# Patient Record
Sex: Male | Born: 1960 | Race: White | Hispanic: No | Marital: Married | Smoking: Current every day smoker
Health system: Southern US, Community
[De-identification: ages and names within clinical notes are randomized; demographics above are authoritative.]

## PROBLEM LIST (undated history)

## (undated) DIAGNOSIS — I340 Nonrheumatic mitral (valve) insufficiency: Secondary | ICD-10-CM

## (undated) DIAGNOSIS — I2510 Atherosclerotic heart disease of native coronary artery without angina pectoris: Secondary | ICD-10-CM

## (undated) DIAGNOSIS — I10 Essential (primary) hypertension: Secondary | ICD-10-CM

## (undated) DIAGNOSIS — E7800 Pure hypercholesterolemia, unspecified: Secondary | ICD-10-CM

## (undated) HISTORY — PX: CORONARY ANGIOPLASTY WITH STENT PLACEMENT: SHX49

## (undated) HISTORY — PX: CORONARY ANGIOPLASTY: SHX604

---

## 2004-10-20 ENCOUNTER — Ambulatory Visit: Payer: Self-pay

## 2004-11-06 ENCOUNTER — Ambulatory Visit: Payer: Self-pay

## 2005-03-14 ENCOUNTER — Inpatient Hospital Stay: Payer: Self-pay | Admitting: Unknown Physician Specialty

## 2006-08-15 ENCOUNTER — Emergency Department: Payer: Self-pay | Admitting: Emergency Medicine

## 2010-07-17 ENCOUNTER — Emergency Department: Payer: Self-pay | Admitting: Unknown Physician Specialty

## 2012-04-21 ENCOUNTER — Ambulatory Visit: Payer: Self-pay | Admitting: Gastroenterology

## 2013-06-20 ENCOUNTER — Ambulatory Visit: Payer: Self-pay | Admitting: Family Medicine

## 2013-12-09 ENCOUNTER — Ambulatory Visit: Payer: Self-pay | Admitting: Physician Assistant

## 2013-12-09 LAB — RAPID STREP-A WITH REFLX: MICRO TEXT REPORT: NEGATIVE

## 2013-12-09 LAB — RAPID INFLUENZA A&B ANTIGENS (ARMC ONLY)

## 2013-12-12 LAB — BETA STREP CULTURE(ARMC)

## 2015-09-06 ENCOUNTER — Other Ambulatory Visit: Payer: Self-pay

## 2015-09-06 ENCOUNTER — Ambulatory Visit
Admission: EM | Admit: 2015-09-06 | Discharge: 2015-09-06 | Disposition: A | Discharge status: Other Acute Inpatient Hospital | Payer: Medicare Other | Attending: Internal Medicine | Admitting: Internal Medicine

## 2015-09-06 ENCOUNTER — Encounter: Payer: Self-pay | Admitting: Emergency Medicine

## 2015-09-06 ENCOUNTER — Emergency Department: Payer: Medicare Other

## 2015-09-06 ENCOUNTER — Emergency Department
Admission: EM | Admit: 2015-09-06 | Discharge: 2015-09-06 | Disposition: A | Discharge status: Other Acute Inpatient Hospital | Payer: Medicare Other | Attending: Emergency Medicine | Admitting: Emergency Medicine

## 2015-09-06 DIAGNOSIS — I2510 Atherosclerotic heart disease of native coronary artery without angina pectoris: Secondary | ICD-10-CM | POA: Insufficient documentation

## 2015-09-06 DIAGNOSIS — Z9861 Coronary angioplasty status: Secondary | ICD-10-CM | POA: Diagnosis not present

## 2015-09-06 DIAGNOSIS — Z79899 Other long term (current) drug therapy: Secondary | ICD-10-CM | POA: Insufficient documentation

## 2015-09-06 DIAGNOSIS — I252 Old myocardial infarction: Secondary | ICD-10-CM | POA: Insufficient documentation

## 2015-09-06 DIAGNOSIS — Z7901 Long term (current) use of anticoagulants: Secondary | ICD-10-CM | POA: Diagnosis not present

## 2015-09-06 DIAGNOSIS — I249 Acute ischemic heart disease, unspecified: Secondary | ICD-10-CM | POA: Insufficient documentation

## 2015-09-06 DIAGNOSIS — R079 Chest pain, unspecified: Secondary | ICD-10-CM | POA: Diagnosis not present

## 2015-09-06 DIAGNOSIS — Z720 Tobacco use: Secondary | ICD-10-CM | POA: Diagnosis not present

## 2015-09-06 DIAGNOSIS — E7800 Pure hypercholesterolemia, unspecified: Secondary | ICD-10-CM | POA: Diagnosis not present

## 2015-09-06 DIAGNOSIS — I10 Essential (primary) hypertension: Secondary | ICD-10-CM | POA: Diagnosis not present

## 2015-09-06 DIAGNOSIS — R112 Nausea with vomiting, unspecified: Secondary | ICD-10-CM | POA: Diagnosis present

## 2015-09-06 DIAGNOSIS — Z7982 Long term (current) use of aspirin: Secondary | ICD-10-CM | POA: Diagnosis not present

## 2015-09-06 DIAGNOSIS — F17200 Nicotine dependence, unspecified, uncomplicated: Secondary | ICD-10-CM | POA: Diagnosis not present

## 2015-09-06 DIAGNOSIS — I959 Hypotension, unspecified: Secondary | ICD-10-CM | POA: Diagnosis present

## 2015-09-06 HISTORY — DX: Pure hypercholesterolemia, unspecified: E78.00

## 2015-09-06 HISTORY — DX: Essential (primary) hypertension: I10

## 2015-09-06 HISTORY — DX: Nonrheumatic mitral (valve) insufficiency: I34.0

## 2015-09-06 HISTORY — DX: Atherosclerotic heart disease of native coronary artery without angina pectoris: I25.10

## 2015-09-06 LAB — CBC
HEMATOCRIT: 40.5 % (ref 40.0–52.0)
HEMOGLOBIN: 13.8 g/dL (ref 13.0–18.0)
MCH: 31.3 pg (ref 26.0–34.0)
MCHC: 34.1 g/dL (ref 32.0–36.0)
MCV: 91.8 fL (ref 80.0–100.0)
Platelets: 389 10*3/uL (ref 150–440)
RBC: 4.41 MIL/uL (ref 4.40–5.90)
RDW: 13.5 % (ref 11.5–14.5)
WBC: 12.4 10*3/uL — AB (ref 3.8–10.6)

## 2015-09-06 LAB — PROTIME-INR
INR: 1.03
PROTHROMBIN TIME: 13.7 s (ref 11.4–15.0)

## 2015-09-06 LAB — BASIC METABOLIC PANEL
ANION GAP: 7 (ref 5–15)
BUN: 15 mg/dL (ref 6–20)
CHLORIDE: 104 mmol/L (ref 101–111)
CO2: 25 mmol/L (ref 22–32)
Calcium: 9.4 mg/dL (ref 8.9–10.3)
Creatinine, Ser: 1.42 mg/dL — ABNORMAL HIGH (ref 0.61–1.24)
GFR calc Af Amer: >60 mL/min (ref >60)
GFR, EST NON AFRICAN AMERICAN: 54 mL/min — AB (ref >60)
Glucose, Bld: 109 mg/dL — ABNORMAL HIGH (ref 65–99)
POTASSIUM: 3.5 mmol/L (ref 3.5–5.1)
SODIUM: 136 mmol/L (ref 135–145)

## 2015-09-06 LAB — TROPONIN I: Troponin I: <0.03 ng/mL (ref <0.031)

## 2015-09-06 MED ORDER — METOPROLOL TARTRATE 25 MG PO TABS
12.5000 mg | ORAL_TABLET | Freq: Once | ORAL | Status: AC
  Administered 2015-09-06: 12.5 mg via ORAL
  Filled 2015-09-06: qty 1

## 2015-09-06 MED ORDER — ASPIRIN 81 MG PO CHEW
324.0000 mg | CHEWABLE_TABLET | Freq: Once | ORAL | Status: AC
  Administered 2015-09-06: 324 mg via ORAL

## 2015-09-06 NOTE — ED Provider Notes (Signed)
CSN: [id number]     Arrival date & time 09/06/15  0845 History   First MD Initiated Contact with Patient 09/06/15 [phone]     Chief Complaint  Patient presents with  . Nausea  . Emesis  . Hypotension   HPI  Patient is a 54 year old gentleman who had a inferior wall coronary event, non-STEMI, treated at [facility] with stenting 2 per the patient, on 08/15/2015. He was very active working in the yard yesterday, mowing, then moving a lot of boxes. His companion reports that he was very winded. In the night, he was up vomiting, and had multiple episodes of diarrhea. His companion found blood on the wall this morning. Vital signs checked at home demonstrated new bradycardia, heart rate 50s. Patient continues with nausea, and intermittent chest pain. He is not dizzy. He was able to walk into the urgent care independently. No leg swelling. Patient was given 325 mg of aspirin by mouth.  Past Medical History  Diagnosis Date  . Coronary artery disease   . Hypertension   . High cholesterol    Past Surgical History  Procedure Laterality Date  . Coronary angioplasty     History reviewed. No pertinent family history. Social History  Substance Use Topics  . Smoking status: Current Every Day Smoker  . Smokeless tobacco: Never Used  . Alcohol Use: 7.2 - 10.8 oz/week    12-18 Cans of beer per week     Comment: 1-2 a month    Review of Systems  All other systems reviewed and are negative.   Allergies  Review of patient's allergies indicates no known allergies.  Home Medications   Prior to Admission medications   Medication Sig Start Date End Date Taking? Authorizing Provider  albuterol (PROVENTIL HFA;VENTOLIN HFA) 108 (90 BASE) MCG/ACT inhaler Inhale into the lungs every 6 (six) hours as needed for wheezing or shortness of breath.   Yes Historical Provider, MD  atorvastatin (LIPITOR) 20 MG tablet Take 20 mg by mouth daily.   Yes Historical Provider, MD  busPIRone (BUSPAR) 10 MG tablet Take 10  mg by mouth 3 (three) times daily.   Yes Historical Provider, MD  citalopram (CELEXA) 20 MG tablet Take 20 mg by mouth daily.   Yes Historical Provider, MD  dicyclomine (BENTYL) 10 MG capsule Take 10 mg by mouth 4 (four) times daily -  before meals and at bedtime.   Yes Historical Provider, MD  ibuprofen (ADVIL,MOTRIN) 200 MG tablet Take 200 mg by mouth every 6 (six) hours as needed.   Yes Historical Provider, MD  lisinopril (PRINIVIL,ZESTRIL) 10 MG tablet Take 10 mg by mouth daily.   Yes Historical Provider, MD  metoprolol (LOPRESSOR) 50 MG tablet Take 50 mg by mouth 2 (two) times daily.   Yes Historical Provider, MD  nitroGLYCERIN (NITROSTAT) 0.4 MG SL tablet Place 0.4 mg under the tongue every 5 (five) minutes as needed for chest pain.   Yes Historical Provider, MD  omeprazole (PRILOSEC) 20 MG capsule Take 20 mg by mouth daily.   Yes Historical Provider, MD  oxyCODONE (ROXICODONE) 15 MG immediate release tablet Take 15 mg by mouth every 4 (four) hours as needed for pain.   Yes Historical Provider, MD  QUEtiapine (SEROQUEL) 100 MG tablet Take 100 mg by mouth at bedtime.   Yes Historical Provider, MD  ticagrelor (BRILINTA) 90 MG TABS tablet Take by mouth 2 (two) times daily.   Yes Historical Provider, MD   Meds Ordered and Administered this Visit  Medications  aspirin chewable tablet 324 mg (324 mg Oral Given 09/06/15 0921)    BP 123/70 mmHg  Pulse 59  Resp 20  SpO2 100% Orthostatic VS for the past 24 hrs:  BP- Lying Pulse- Lying BP- Sitting Pulse- Sitting BP- Standing at 0 minutes Pulse- Standing at 0 minutes  09/06/15 0919 123/67 mmHg 60 123/71 mmHg 60 129/67 mmHg 60    Physical Exam  Constitutional: He is oriented to person, place, and time. No distress.  Alert, nicely groomed  HENT:  Head: Atraumatic.  Eyes:  Conjugate gaze, no eye redness/drainage  Neck: Neck supple.  Cardiovascular: Regular rhythm.   Heart rate 50s  Pulmonary/Chest: No respiratory distress. He has no  wheezes. He has no rales.  Lungs clear, symmetric breath sounds  Abdominal: Soft. He exhibits no distension. There is no rebound and no guarding.  Protuberant, mildly tender in the right lower quadrant to deep palpation  Musculoskeletal:  Trace distal lower extremity edema on the left, 1+ lower extremity edema on the right  Neurological: He is alert and oriented to person, place, and time.  Skin: Skin is warm and dry.  Pink. No cyanosis  Nursing note and vitals reviewed.   ED Course  Procedures (including critical care time)  I reviewed the patient's EKG, obtained at the urgent care on 09/06/2015: Sinus rhythm at a rate of 58 bpm, first-degree heart block with a PR interval of 244 ms, QRS duration 88 ms, QTC 416 ms. QRS axis -4. No acute ST/T-wave changes appreciated today. Q waves are demonstrated in the inferior leads.  I was not able to view the actual tracing of his previous EKG at [facility] on September 10; reported result of that tracing was demonstration of inferior wall changes.  MDM   1. Chest pain with high risk of acute coronary syndrome    Patient had coronary stenting less than 30 days ago, and is in the risk group for reocclusion; new exertional symptoms could be anginal/ischemic equivalent. Patient reports new, mild, bradycardia (heart rate 50s) Does not appear to be actively GI bleeding. Transfer by EMS to ED for evaluation for possible reocclusion/acute coronary syndrome.    Eustace Moore, MD 09/06/15 1024

## 2015-09-06 NOTE — ED Notes (Signed)
BP cuff malpositioned. Adjusted and BP repeated.

## 2015-09-06 NOTE — ED Notes (Addendum)
Pt reports chest pain for past 2-3 days for which he has taken nitro SL 2-3x/day with pain relief. No c/o chest pin at this time. Per pt's wife, he was very reluctant to come to hospital. Pt states he was awakened by nausea and vomited x1 with blood per wife, also c/o diarrhea x1. Pt taking plavix.

## 2015-09-06 NOTE — ED Notes (Signed)
Report called to [facility] transfer center. Ambulance to arrive around 2100.

## 2015-09-06 NOTE — ED Provider Notes (Addendum)
[facility] Emergency Department Provider Note  ____________________________________________   I have reviewed the triage vital signs and the nursing notes.   HISTORY  Chief Complaint Chest Pain and Hemoptysis    HPI Derek Solis is a 54 y.o. male with a history of GERD, diverticulosis, and an nSTEMI with 2 stents to the RCA at [facility] on the ninth of last month. Patient states that he woke up a little Vomited 1. It was noted there was blood in the vomit. He has not vomited since that time. He did have 3 loose bowel movements which were not bloody since that time. He is not having any abdominal pain or chest pain at this time. However, patient does state that he has had recurrent left-sided chest pain since he went home from his cardiac catheterization and especially over the last 3 days he has had some exertional chest discomfort with shortness of breath which is relieved by nitroglycerin. He has not talked was cardiologist about it. He has not had any chest pain while here. According to notes was given aspirin prior to arrival. He states he has been using his nitroglycerin glycerin several times a day over the last several days has not use to today  Past Medical History  Diagnosis Date  . Coronary artery disease   . Hypertension   . High cholesterol   . MI (mitral incompetence)     There are no active problems to display for this patient.   Past Surgical History  Procedure Laterality Date  . Coronary angioplasty    . Coronary angioplasty with stent placement      Current Outpatient Rx  Name  Route  Sig  Dispense  Refill  . albuterol (PROVENTIL HFA;VENTOLIN HFA) 108 (90 BASE) MCG/ACT inhaler   Inhalation   Inhale into the lungs every 6 (six) hours as needed for wheezing or shortness of breath.         Marland Kitchen atorvastatin (LIPITOR) 20 MG tablet   Oral   Take 20 mg by mouth daily.         . busPIRone (BUSPAR) 10 MG tablet   Oral   Take 10 mg by  mouth 3 (three) times daily.         . citalopram (CELEXA) 20 MG tablet   Oral   Take 20 mg by mouth daily.         Marland Kitchen dicyclomine (BENTYL) 10 MG capsule   Oral   Take 10 mg by mouth 4 (four) times daily -  before meals and at bedtime.         Marland Kitchen ibuprofen (ADVIL,MOTRIN) 200 MG tablet   Oral   Take 200 mg by mouth every 6 (six) hours as needed.         Marland Kitchen lisinopril (PRINIVIL,ZESTRIL) 10 MG tablet   Oral   Take 10 mg by mouth daily.         . metoprolol (LOPRESSOR) 50 MG tablet   Oral   Take 50 mg by mouth 2 (two) times daily.         . nitroGLYCERIN (NITROSTAT) 0.4 MG SL tablet   Sublingual   Place 0.4 mg under the tongue every 5 (five) minutes as needed for chest pain.         Marland Kitchen omeprazole (PRILOSEC) 20 MG capsule   Oral   Take 20 mg by mouth daily.         Marland Kitchen oxyCODONE (ROXICODONE) 15 MG immediate release tablet  Oral   Take 15 mg by mouth every 4 (four) hours as needed for pain.         Marland Kitchen QUEtiapine (SEROQUEL) 100 MG tablet   Oral   Take 100 mg by mouth at bedtime.         . ticagrelor (BRILINTA) 90 MG TABS tablet   Oral   Take by mouth 2 (two) times daily.           Allergies Review of patient's allergies indicates no known allergies.  History reviewed. No pertinent family history.  Social History Social History  Substance Use Topics  . Smoking status: Current Every Day Smoker -- 1.00 packs/day    Types: Cigarettes  . Smokeless tobacco: Never Used  . Alcohol Use: 7.2 - 10.8 oz/week    12-18 Cans of beer per week     Comment: 1-2 a month    Review of Systems Constitutional: No fever/chills Eyes: No visual changes. ENT: No sore throat. No stiff neck no neck pain Cardiovascular: chest pain. Respiratory: Denies shortness of breath. Gastrointestinal:   No melena Genitourinary: Negative for dysuria. Musculoskeletal: Negative lower extremity swelling Skin: Negative for rash. Neurological: Negative for headaches, focal weakness or  numbness. 10-point ROS otherwise negative.  ____________________________________________   PHYSICAL EXAM:  VITAL SIGNS: ED Triage Vitals  Enc Vitals Group     BP 09/06/15 1028 132/88 mmHg     Pulse Rate 09/06/15 1028 56     Resp 09/06/15 1028 20     Temp 09/06/15 1028 97.8 F (36.6 C)     Temp Source 09/06/15 1028 Oral     SpO2 09/06/15 1028 100 %     Weight 09/06/15 1028 185 lb (83.915 kg)     Height 09/06/15 1028  (1.803 m)     Head Cir --      Peak Flow --      Pain Score 09/06/15 1020 0     Pain Loc --      Pain Edu? --      Excl. in [place]? --     Constitutional: Alert and oriented. Well appearing and in no acute distress. Eyes: Conjunctivae are normal. PERRL. EOMI. Head: Atraumatic. Nose: No congestion/rhinnorhea. Mouth/Throat: Mucous membranes are moist.  Oropharynx non-erythematous. Neck: No stridor.   Nontender with no meningismus Cardiovascular: Normal rate, regular rhythm. Grossly normal heart sounds.  Good peripheral circulation. Respiratory: Normal respiratory effort.  No retractions. Lungs CTAB. Gastrointestinal: Soft and nontender. No distention. No guarding no rebound Back:  There is no focal tenderness or step off there is no midline tenderness there are no lesions noted. there is no CVA tenderness Rectal: Guaiac-negative stool Musculoskeletal: No lower extremity tenderness. No joint effusions, no DVT signs strong distal pulses no edema Neurologic:  Normal speech and language. No gross focal neurologic deficits are appreciated.  Skin:  Skin is warm, dry and intact. No rash noted. Psychiatric: Mood and affect are normal. Speech and behavior are normal.  ____________________________________________   LABS (all labs ordered are listed, but only abnormal results are displayed)  Labs Reviewed  BASIC METABOLIC PANEL - Abnormal; Notable for the following:    Glucose, Bld 109 (*)    Creatinine, Ser 1.42 (*)    GFR calc non Af Amer 54 (*)    All other  components within normal limits  CBC - Abnormal; Notable for the following:    WBC 12.4 (*)    All other components within normal limits  TROPONIN I  PROTIME-INR   ____________________________________________  EKG  Normal sinus rhythm rate 59 bpm, mild bradycardia noted, no acute ST elevation or depression, first degree AV block noted. ST flattening noted inferiorly. Flipped T waves in 3. ____________________________________________  RADIOLOGY  I have reviewed films ____________________________________________   PROCEDURES  Procedure(s) performed: None  Critical Care performed: None  ____________________________________________   INITIAL IMPRESSION / ASSESSMENT AND PLAN / ED COURSE  Pertinent labs & imaging results that were available during my care of the patient were reviewed by me and considered in my medical decision making (see chart for details).  Patient has had some GI upset with vomiting and loose stools today and no evidence of lower GI bleed and no persistent upper bleeding to suggest ongoing upper GI bleed. He is on Plavix however. Hemoglobin is reassuring vital signs are reassuring. More concerning to me, however, is the fact that the patient has been having ongoing pain in his chest which he describes as similar to his heart attack, he is not having the pain right now, I will discuss with cardiology at [facility] whom I have paged about what they would like to do about this. We'll monitor the patient closely here.. ____________________________________________   ----------------------------------------- 11:54 AM on 09/06/2015 -----------------------------------------  Dr. Liliana Cline excepts the patient for Dr. Mariane Baumgarten at [facility] cardiology, they have no additional requests and interventions at this time. We'll transfer back to [facility] at their request.  ----------------------------------------- 3:50 PM on 09/06/2015 -----------------------------------------  Patient  still with no chest pain, he would like to eat and drink while we await a bed at [facility]. Second troponin is negative. Still awaiting [facility] placement. I do feel patient should go given his increased anginal symptoms at home. He has had no chest pain here. Resting comfortably in the bed no vomiting or diarrhea abdominal and is benign signed out at the end of my shift  FINAL CLINICAL IMPRESSION(S) / ED DIAGNOSES  Final diagnoses:  None     Jeanmarie Plant, MD 09/06/15 1141  Jeanmarie Plant, MD 09/06/15 1142  Jeanmarie Plant, MD 09/06/15 1155  Jeanmarie Plant, MD 09/06/15 1550

## 2015-09-06 NOTE — ED Provider Notes (Addendum)
-----------------------------------------   5:21 PM on 09/06/2015 -----------------------------------------  Patient continues to await bed availability at [facility]. We will order the patient's normal nighttime medications. Patient is agreeable to continue waiting until bed becomes available.  Minna Antis, MD 09/06/15 1722   [facility] is just caused hitting a bed has become available. We will transfer as soon as possible.  Minna Antis, MD 09/06/15 1725

## 2015-09-06 NOTE — ED Notes (Addendum)
Patient with complaint of nausea, vomiting, low BP and not feeling well. Just had PCI 3 weeks ago for NSTEMI. Vomited last night and there was blood noted

## 2015-09-06 NOTE — ED Notes (Signed)
Report called to [facility] with updated report. Pt going to room 5711. Wife left prior to pt boarding ambulance. Report given to [facility] ambulance crew.

## 2015-09-06 NOTE — ED Notes (Signed)
Per EMS pt had an MI 3 weeks ago and 2 stents placed. Per wife pt started vomiting this morning at approx 1 am with blood in the vomit, wife states significant amount of bloody vomit noted around the toilet. Per wife and patient, he has been taking 2-3 nitro tabs a day since he was D/C with pain relief. Pt denies pain at this time, states intermittent chest pain.

## 2019-09-02 ENCOUNTER — Other Ambulatory Visit: Payer: Self-pay

## 2019-09-02 ENCOUNTER — Ambulatory Visit
Admission: EM | Admit: 2019-09-02 | Discharge: 2019-09-02 | Disposition: A | Discharge status: Home / Self Care | Payer: Medicare Other | Attending: Family Medicine | Admitting: Family Medicine

## 2019-09-02 ENCOUNTER — Encounter: Payer: Self-pay | Admitting: Emergency Medicine

## 2019-09-02 DIAGNOSIS — W540XXA Bitten by dog, initial encounter: Secondary | ICD-10-CM

## 2019-09-02 DIAGNOSIS — S0081XA Abrasion of other part of head, initial encounter: Secondary | ICD-10-CM | POA: Diagnosis not present

## 2019-09-02 DIAGNOSIS — S61509A Unspecified open wound of unspecified wrist, initial encounter: Secondary | ICD-10-CM

## 2019-09-02 NOTE — Discharge Instructions (Addendum)
Go directly to the emergency room as discussed.  °

## 2019-09-02 NOTE — ED Provider Notes (Signed)
[facility] ____________________________________________  Time seen: Approximately 1:44 PM  I have reviewed the triage vital signs and the nursing notes.   HISTORY  Chief Complaint Animal Bite   HPI Derek Solis is a 58 y.o. male presenting with wife at bedside for evaluation of multiple dog bites.  Just prior to arrival patient his wife had received a great Dane dog, and states as he went to grab the dog's collar the dog turned and bit him multiple times to head and both upper extremities.  Reports a lot of pain to his wrist.  Reports last tetanus immunization was 1 year ago.  Denies paresthesias, loss of consciousness or other injuries.  Reports to their knowledge the dog was up-to-date on immunization.  Wife did call animal control just prior to arrival.  No recent sickness.  Reports he is now only on aspirin and no other blood thinner.   Past Medical History:  Diagnosis Date  . Coronary artery disease   . High cholesterol   . Hypertension   . MI (mitral incompetence)     There are no active problems to display for this patient.   Past Surgical History:  Procedure Laterality Date  . CORONARY ANGIOPLASTY    . CORONARY ANGIOPLASTY WITH STENT PLACEMENT       No current facility-administered medications for this encounter.   Current Outpatient Medications:  .  albuterol (PROVENTIL HFA;VENTOLIN HFA) 108 (90 BASE) MCG/ACT inhaler, Inhale into the lungs every 6 (six) hours as needed for wheezing or shortness of breath., Disp: , Rfl:  .  atorvastatin (LIPITOR) 20 MG tablet, Take 20 mg by mouth daily., Disp: , Rfl:  .  busPIRone (BUSPAR) 10 MG tablet, Take 10 mg by mouth 3 (three) times daily., Disp: , Rfl:  .  citalopram (CELEXA) 20 MG tablet, Take 20 mg by mouth daily., Disp: , Rfl:  .  dicyclomine (BENTYL) 10 MG capsule, Take 10 mg by mouth 4 (four) times daily -  before meals and at bedtime., Disp: , Rfl:  .  ibuprofen (ADVIL,MOTRIN) 200 MG tablet, Take 200  mg by mouth every 6 (six) hours as needed., Disp: , Rfl:  .  lisinopril (PRINIVIL,ZESTRIL) 10 MG tablet, Take 10 mg by mouth daily., Disp: , Rfl:  .  metoprolol (LOPRESSOR) 50 MG tablet, Take 50 mg by mouth 2 (two) times daily., Disp: , Rfl:  .  nitroGLYCERIN (NITROSTAT) 0.4 MG SL tablet, Place 0.4 mg under the tongue every 5 (five) minutes as needed for chest pain., Disp: , Rfl:  .  omeprazole (PRILOSEC) 20 MG capsule, Take 20 mg by mouth daily., Disp: , Rfl:  .  oxyCODONE (ROXICODONE) 15 MG immediate release tablet, Take 15 mg by mouth every 4 (four) hours as needed for pain., Disp: , Rfl:  .  QUEtiapine (SEROQUEL) 100 MG tablet, Take 100 mg by mouth at bedtime., Disp: , Rfl:  .  ticagrelor (BRILINTA) 90 MG TABS tablet, Take by mouth 2 (two) times daily., Disp: , Rfl:   Allergies Patient has no known allergies.  No family history on file.  Social History Social History   Tobacco Use  . Smoking status: Current Every Day Smoker    Packs/day: 1.00    Types: Cigarettes  . Smokeless tobacco: Never Used  Substance Use Topics  . Alcohol use: Yes    Alcohol/week: 12.0 - 18.0 standard drinks    Types: 12 - 18 Cans of beer per week    Comment: 1-2 a month  .  Drug use: No    Review of Systems Constitutional: No fever ENT: No sore throat. Cardiovascular: Denies chest pain. Respiratory: Denies shortness of breath. Musculoskeletal: Negative for back pain. Skin: Positive lacerations and puncture wounds.  ____________________________________________   PHYSICAL EXAM:  VITAL SIGNS: ED Triage Vitals  Enc Vitals Group     BP 09/02/19 1316 130/79     Pulse Rate 09/02/19 1316 71     Resp 09/02/19 1316 18     Temp 09/02/19 1316 98.1 F (36.7 C)     Temp Source 09/02/19 1316 Oral     SpO2 09/02/19 1316 97 %     Weight --      Height --      Head Circumference --      Peak Flow --      Pain Score 09/02/19 1314 5     Pain Loc --      Pain Edu? --      Excl. in [place]? --      Constitutional: Alert and oriented. Well appearing and in no acute distress. Eyes: Conjunctivae are normal. ENT      Head: Normocephalic and atraumatic. Cardiovascular: Good peripheral circulation. Respiratory: Normal respiratory effort without tachypnea nor retractions. Neurologic:  Normal speech and language.  Skin:  Skin is warm, dry.  Except: Right forehead and beneath eye superficial abrasion noted, multiple bilateral upper extremity small puncture wounds, right lateral forearm approximate 3 cm linear laceration with mild active bleeding and approximately 3 cm laceration to volar aspect of distal wrist with gaping and active bleeding with pain. Psychiatric: Mood and affect are normal. Speech and behavior are normal. Patient exhibits appropriate insight and judgment   ___________________________________________   LABS (all labs ordered are listed, but only abnormal results are displayed)  Labs Reviewed - No data to display   PROCEDURES Procedures     INITIAL IMPRESSION / ASSESSMENT AND PLAN / ED COURSE  Pertinent labs & imaging results that were available during my care of the patient were reviewed by me and considered in my medical decision making (see chart for details).  Patient presenting with wife to urgent care with multiple puncture wounds and lacerations from large breed dog bite that occurred just prior to arrival.  Due to depth of laceration recommend further evaluation emergency room at this time.  Wife states she will take husband directly to [facility].  Patient wounds dressed by nursing staff and stable at discharge. ____________________________________________   FINAL CLINICAL IMPRESSION(S) / ED DIAGNOSES  Final diagnoses:  Dog bite, initial encounter     ED Discharge Orders    None       Note: This dictation was prepared with Dragon dictation along with smaller phrase technology. Any transcriptional errors that result from this process are  unintentional.         Marylene Land, NP 09/02/19 1449

## 2020-05-11 ENCOUNTER — Encounter: Payer: Self-pay | Admitting: Emergency Medicine

## 2020-05-11 ENCOUNTER — Ambulatory Visit
Admission: EM | Admit: 2020-05-11 | Discharge: 2020-05-11 | Disposition: A | Discharge status: Home / Self Care | Payer: Medicare Other | Attending: Family Medicine | Admitting: Family Medicine

## 2020-05-11 ENCOUNTER — Other Ambulatory Visit: Payer: Self-pay

## 2020-05-11 DIAGNOSIS — L02412 Cutaneous abscess of left axilla: Secondary | ICD-10-CM

## 2020-05-11 MED ORDER — DOXYCYCLINE HYCLATE 100 MG PO TABS: 100.0000 mg | ORAL_TABLET | Freq: Two times a day (BID) | ORAL | 0 refills | Status: DC

## 2020-05-11 NOTE — ED Provider Notes (Signed)
[facility]    CSN: [id number] Arrival date & time: 05/11/20  1245      History   Chief Complaint Chief Complaint  Patient presents with  . Abscess    left armpit    HPI Derek Solis is a 59 y.o. male.   59 yo male with a c/o left armpit redness and tenderness for the past 2 days. Denies any injuries, fevers, chills, drainage.    Abscess   Past Medical History:  Diagnosis Date  . Coronary artery disease   . High cholesterol   . Hypertension   . MI (mitral incompetence)     There are no problems to display for this patient.   Past Surgical History:  Procedure Laterality Date  . CORONARY ANGIOPLASTY    . CORONARY ANGIOPLASTY WITH STENT PLACEMENT         Home Medications    Prior to Admission medications   Medication Sig Start Date End Date Taking? Authorizing Provider  aspirin 81 MG EC tablet Take by mouth. 06/24/17  Yes [provider]  atorvastatin (LIPITOR) 20 MG tablet Take 20 mg by mouth daily.   Yes [provider]  busPIRone (BUSPAR) 10 MG tablet Take 10 mg by mouth 3 (three) times daily.   Yes [provider]  citalopram (CELEXA) 20 MG tablet Take 20 mg by mouth daily.   Yes [provider]  ferrous sulfate 325 (65 FE) MG tablet TAKE 1 TABLET(325 MG) BY MOUTH THREE TIMES DAILY WITH A MEAL 12/27/19  Yes [provider]  isosorbide mononitrate (IMDUR) 30 MG 24 hr tablet Take by mouth. 04/09/20  Yes [provider]  lisinopril (PRINIVIL,ZESTRIL) 10 MG tablet Take 10 mg by mouth daily.   Yes [provider]  metoprolol (LOPRESSOR) 50 MG tablet Take 50 mg by mouth 2 (two) times daily.   Yes [provider]  omeprazole (PRILOSEC) 20 MG capsule Take 20 mg by mouth daily.   Yes [provider]  QUEtiapine (SEROQUEL) 100 MG tablet Take 100 mg by mouth at bedtime.   Yes [provider]  traZODone (DESYREL) 100 MG tablet Take 100 mg by mouth at bedtime as needed.  12/27/19  Yes [provider]  albuterol (PROVENTIL HFA;VENTOLIN HFA) 108 (90 BASE) MCG/ACT inhaler Inhale into the lungs every 6 (six) hours as needed for wheezing or shortness of breath.    [provider]  dicyclomine (BENTYL) 10 MG capsule Take 10 mg by mouth 4 (four) times daily -  before meals and at bedtime.    [provider]  doxycycline (VIBRA-TABS) 100 MG tablet Take 1 tablet (100 mg total) by mouth 2 (two) times daily. 05/11/20   Derek Mccallum, MD  ibuprofen (ADVIL,MOTRIN) 200 MG tablet Take 200 mg by mouth every 6 (six) hours as needed.    [provider]  nitroGLYCERIN (NITROSTAT) 0.4 MG SL tablet Place 0.4 mg under the tongue every 5 (five) minutes as needed for chest pain.    [provider]  oxyCODONE (ROXICODONE) 15 MG immediate release tablet Take 15 mg by mouth every 4 (four) hours as needed for pain.    [provider]  ticagrelor (BRILINTA) 90 MG TABS tablet Take by mouth 2 (two) times daily.    [provider]    Family History History reviewed. No pertinent family history.  Social History Social History   Tobacco Use  . Smoking status: Current Every Day Smoker    Packs/day: 1.00  Types: Cigarettes  . Smokeless tobacco: Never Used  Substance Use Topics  . Alcohol use: Yes    Alcohol/week: 12.0 - 18.0 standard drinks    Types: 12 - 18 Cans of beer per week    Comment: 1-2 a month  . Drug use: No     Allergies   Patient has no known allergies.   Review of Systems Review of Systems   Physical Exam Triage Vital Signs ED Triage Vitals  Enc Vitals Group     BP 05/11/20 1334 108/66     Pulse Rate 05/11/20 1334 64     Resp 05/11/20 1334 16     Temp 05/11/20 1334 98.5 F (36.9 C)     Temp Source 05/11/20 1334 Oral     SpO2 05/11/20 1334 97 %     Weight 05/11/20 1331 195 lb (88.5 kg)     Height 05/11/20 1331 5\' 11"  (1.803 m)     Head Circumference --      Peak Flow --      Pain Score  05/11/20 1331 7     Pain Loc --      Pain Edu? --      Excl. in [place]? --    No data found.  Updated Vital Signs BP 108/66 (BP Location: Right Arm)   Pulse 64   Temp 98.5 F (36.9 C) (Oral)   Resp 16   Ht 5\' 11"  (1.803 m)   Wt 88.5 kg   SpO2 97%   BMI 27.20 kg/m   Visual Acuity Right Eye Distance:   Left Eye Distance:   Bilateral Distance:    Right Eye Near:   Left Eye Near:    Bilateral Near:     Physical Exam Vitals and nursing note reviewed.  Constitutional:      General: He is not in acute distress.    Appearance: He is not toxic-appearing or diaphoretic.  Skin:    Comments: Left axilla skin with 1.5cm tender, subcutaneous induration with surrounding area 3x4cm of blanchable erythema, warmth and tenderness to palpation; no drainage; no fluctuance  Neurological:     Mental Status: He is alert.      UC Treatments / Results  Labs (all labs ordered are listed, but only abnormal results are displayed) Labs Reviewed - No data to display  EKG   Radiology No results found.  Procedures Procedures (including critical care time)  Medications Ordered in UC Medications - No data to display  Initial Impression / Assessment and Plan / UC Course  I have reviewed the triage vital signs and the nursing notes.  Pertinent labs & imaging results that were available during my care of the patient were reviewed by me and considered in my medical decision making (see chart for details).      Final Clinical Impressions(s) / UC Diagnoses   Final diagnoses:  Abscess of left axilla     Discharge Instructions     Warm compresses to area    ED Prescriptions    Medication Sig Dispense Auth. Provider   doxycycline (VIBRA-TABS) 100 MG tablet Take 1 tablet (100 mg total) by mouth 2 (two) times daily. 20 tablet Norval Gable, MD      1. diagnosis reviewed with patient 2. rx as per orders above; reviewed possible side effects, interactions, risks and benefits  3.  Recommend supportive treatment as above 4. Follow-up prn if symptoms worsen or don't improve   PDMP not reviewed this encounter.   Derek Solis,  Pamala Hurry, MD 05/11/20 1426

## 2020-05-11 NOTE — ED Triage Notes (Signed)
Patient c/o abscess in his left armpit for the past 2 days.  Patient denies fevers.

## 2020-05-11 NOTE — Discharge Instructions (Signed)
Warm compresses to area °

## 2020-08-18 ENCOUNTER — Other Ambulatory Visit: Payer: Self-pay

## 2020-08-18 ENCOUNTER — Ambulatory Visit
Admission: EM | Admit: 2020-08-18 | Discharge: 2020-08-18 | Disposition: A | Discharge status: Home / Self Care | Payer: Medicare Other | Attending: Internal Medicine | Admitting: Internal Medicine

## 2020-08-18 ENCOUNTER — Encounter: Payer: Self-pay | Admitting: Emergency Medicine

## 2020-08-18 DIAGNOSIS — L02411 Cutaneous abscess of right axilla: Secondary | ICD-10-CM

## 2020-08-18 DIAGNOSIS — L02412 Cutaneous abscess of left axilla: Secondary | ICD-10-CM

## 2020-08-18 MED ORDER — DOXYCYCLINE HYCLATE 100 MG PO CAPS: 100.0000 mg | ORAL_CAPSULE | Freq: Two times a day (BID) | ORAL | 0 refills | Status: DC

## 2020-08-18 NOTE — Discharge Instructions (Signed)
Use heat pad on abscess for 15 minutes 2-3 times a day 5 days to help it get better.

## 2020-08-18 NOTE — ED Provider Notes (Signed)
[facility]    CSN: [id number] Arrival date & time: 08/18/20  1141      History   Chief Complaint Chief Complaint  Patient presents with  . Abscess    HPI Derek Solis is a 59 y.o. male who presents with bilateral axilla abscess. The onset of R axilla abscess 3 weeks ago and has moved to the left in the past 1 week. Has had this a few months ago and was placed on antibiotics. Denies a fever.    Past Medical History:  Diagnosis Date  . Coronary artery disease   . High cholesterol   . Hypertension   . MI (mitral incompetence)     There are no problems to display for this patient.   Past Surgical History:  Procedure Laterality Date  . CORONARY ANGIOPLASTY    . CORONARY ANGIOPLASTY WITH STENT PLACEMENT         Home Medications    Prior to Admission medications   Medication Sig Start Date End Date Taking? Authorizing Provider  albuterol (PROVENTIL HFA;VENTOLIN HFA) 108 (90 BASE) MCG/ACT inhaler Inhale into the lungs every 6 (six) hours as needed for wheezing or shortness of breath.   Yes [provider]  aspirin 81 MG EC tablet Take by mouth. 06/24/17  Yes [provider]  atorvastatin (LIPITOR) 20 MG tablet Take 20 mg by mouth daily.   Yes [provider]  busPIRone (BUSPAR) 10 MG tablet Take 10 mg by mouth 3 (three) times daily.   Yes [provider]  citalopram (CELEXA) 20 MG tablet Take 20 mg by mouth daily.   Yes [provider]  ferrous sulfate 325 (65 FE) MG tablet TAKE 1 TABLET(325 MG) BY MOUTH THREE TIMES DAILY WITH A MEAL 12/27/19  Yes [provider]  ibuprofen (ADVIL,MOTRIN) 200 MG tablet Take 200 mg by mouth every 6 (six) hours as needed.   Yes [provider]  isosorbide mononitrate (IMDUR) 30 MG 24 hr tablet Take by mouth. 04/09/20  Yes [provider]  lisinopril (PRINIVIL,ZESTRIL) 10 MG tablet Take 10 mg by mouth daily.   Yes [provider]  metoprolol  (LOPRESSOR) 50 MG tablet Take 50 mg by mouth 2 (two) times daily.   Yes [provider]  nitroGLYCERIN (NITROSTAT) 0.4 MG SL tablet Place 0.4 mg under the tongue every 5 (five) minutes as needed for chest pain.   Yes [provider]  omeprazole (PRILOSEC) 20 MG capsule Take 20 mg by mouth daily.   Yes [provider]  QUEtiapine (SEROQUEL) 100 MG tablet Take 100 mg by mouth at bedtime.   Yes [provider]  traZODone (DESYREL) 100 MG tablet Take 100 mg by mouth at bedtime as needed. 12/27/19  Yes [provider]  dicyclomine (BENTYL) 10 MG capsule Take 10 mg by mouth 4 (four) times daily -  before meals and at bedtime.    [provider]  doxycycline (VIBRA-TABS) 100 MG tablet Take 1 tablet (100 mg total) by mouth 2 (two) times daily. 05/11/20   [name], MD  oxyCODONE (ROXICODONE) 15 MG immediate release tablet Take 15 mg by mouth every 4 (four) hours as needed for pain.    [provider]  ticagrelor (BRILINTA) 90 MG TABS tablet Take by mouth 2 (two) times daily.    [provider]    Family History History reviewed. No pertinent family history.  Social History Social History   Tobacco Use  . Smoking status: Current  Every Day Smoker    Packs/day: 1.00    Types: Cigarettes  . Smokeless tobacco: Never Used  Vaping Use  . Vaping Use: Never used  Substance Use Topics  . Alcohol use: Yes    Alcohol/week: 12.0 - 18.0 standard drinks    Types: 12 - 18 Cans of beer per week    Comment: 1-2 a month  . Drug use: No     Allergies   Patient has no known allergies.   Review of Systems Review of Systems  Constitutional: Negative for fever.  HENT: Negative for congestion.   Respiratory: Negative for cough.   Musculoskeletal: Negative for gait problem and myalgias.  Skin: Negative for rash.       Has lumps and redness on axilla  Hematological: Negative for adenopathy.   Physical Exam Triage Vital Signs ED  Triage Vitals  Enc Vitals Group     BP 08/18/20 1255 (!) 149/76     Pulse Rate 08/18/20 1255 64     Resp 08/18/20 1255 18     Temp 08/18/20 1255 98.2 F (36.8 C)     Temp Source 08/18/20 1255 Oral     SpO2 08/18/20 1255 99 %     Weight 08/18/20 1251 195 lb 1.7 oz (88.5 kg)     Height 08/18/20 1251 5\' 11"  (1.803 m)     Head Circumference --      Peak Flow --      Pain Score 08/18/20 1251 7     Pain Loc --      Pain Edu? --      Excl. in [place]? --    No data found.  Updated Vital Signs BP (!) 149/76 (BP Location: Left Arm)   Pulse 64   Temp 98.2 F (36.8 C) (Oral)   Resp 18   Ht 5\' 11"  (1.803 m)   Wt 195 lb 1.7 oz (88.5 kg)   SpO2 99%   BMI 27.21 kg/m   Visual Acuity Right Eye Distance:   Left Eye Distance:   Bilateral Distance:    Right Eye Near:   Left Eye Near:    Bilateral Near:     Physical Exam Constitutional:      General: He is not in acute distress.    Appearance: He is not toxic-appearing.  HENT:     Head: Normocephalic.     Right Ear: Tympanic membrane and external ear normal.     Left Ear: Tympanic membrane, ear canal and external ear normal.  Eyes:     General: No scleral icterus.    Conjunctiva/sclera: Conjunctivae normal.  Pulmonary:     Effort: Pulmonary effort is normal.  Musculoskeletal:        General: Normal range of motion.     Cervical back: Neck supple.     Comments: R AXILLA- with 4 superficial lumps 1-1.5 cm in size and only one is red and tender.  L AXILLA- 2x1 cm oval superficial abscess.   Skin:    General: Skin is warm and dry.  Neurological:     Mental Status: He is alert and oriented to person, place, and time.     Gait: Gait normal.  Psychiatric:        Mood and Affect: Mood normal.        Behavior: Behavior normal.        Thought Content: Thought content normal.        Judgment: Judgment normal.    UC Treatments / Results  Labs (all labs ordered are listed, but only abnormal results are displayed) Labs Reviewed - No  data to display  EKG   Radiology No results found.  Procedures Procedures (including critical care time)  Medications Ordered in UC Medications - No data to display  Initial Impression / Assessment and Plan / UC Course  I have reviewed the triage vital signs and the nursing notes. Has bilateral axilla abscesses. I placed him on Doxycycline as noted. Advised to apply heat to areas. See instructions.  Final Clinical Impressions(s) / UC Diagnoses   Final diagnoses:  None   Discharge Instructions   None    ED Prescriptions    None     PDMP not reviewed this encounter.   Garey Ham, [place] 08/18/20 2054

## 2020-08-18 NOTE — ED Triage Notes (Signed)
Pt has abscess in bilateral axilla. Started about 3 weeks ago. Started out on in right axilla and moved to left.

## 2021-08-14 ENCOUNTER — Ambulatory Visit
Admission: EM | Admit: 2021-08-14 | Discharge: 2021-08-14 | Disposition: A | Discharge status: Home / Self Care | Payer: Medicare Other | Attending: Emergency Medicine | Admitting: Emergency Medicine

## 2021-08-14 ENCOUNTER — Encounter: Payer: Self-pay | Admitting: Emergency Medicine

## 2021-08-14 ENCOUNTER — Other Ambulatory Visit: Payer: Self-pay

## 2021-08-14 DIAGNOSIS — H00025 Hordeolum internum left lower eyelid: Secondary | ICD-10-CM | POA: Diagnosis not present

## 2021-08-14 DIAGNOSIS — S0502XA Injury of conjunctiva and corneal abrasion without foreign body, left eye, initial encounter: Secondary | ICD-10-CM | POA: Diagnosis not present

## 2021-08-14 MED ORDER — TOBRAMYCIN-DEXAMETHASONE 0.3-0.1 % OP SUSP: 2.0000 [drp] | Freq: Four times a day (QID) | OPHTHALMIC | 0 refills | Status: DC

## 2021-08-14 NOTE — Discharge Instructions (Addendum)
Instill 2 drops of Tobradex in your left eye 4 times a day for 7 days.  If you develop worsening pain in your left eye, or changes to your vision go to the ER for evaluation.

## 2021-08-14 NOTE — ED Provider Notes (Signed)
[facility]    CSN: [id number] Arrival date & time: 08/14/21  1617      History   Chief Complaint Chief Complaint  Patient presents with   Eye Problem    HPI Derek Solis is a 60 y.o. male.   HPI  60 year old male here for evaluation of eye complaints.  Patient reports that he developed a foreign body sensation in his left eye yesterday while walking to the parking deck at [facility].  He states that the pain is continued today and he feels like there is something in his eye.  His wife is flush his eye with eyewash several times without relief of symptoms.  He states that his vision is a little blurry in the left eye.  He reports that he was mowing the grass today but did not do anything yesterday that might of caused a foreign body to go into his eye.  Patient denies drainage.  Past Medical History:  Diagnosis Date   Coronary artery disease    High cholesterol    Hypertension    MI (mitral incompetence)     There are no problems to display for this patient.   Past Surgical History:  Procedure Laterality Date   CORONARY ANGIOPLASTY     CORONARY ANGIOPLASTY WITH STENT PLACEMENT         Home Medications    Prior to Admission medications   Medication Sig Start Date End Date Taking? Authorizing Provider  tobramycin-dexamethasone [facility]) ophthalmic solution Place 2 drops into the left eye every 6 (six) hours. 08/14/21  Yes Becky Augusta, NP  albuterol (PROVENTIL HFA;VENTOLIN HFA) 108 (90 BASE) MCG/ACT inhaler Inhale into the lungs every 6 (six) hours as needed for wheezing or shortness of breath.    [provider]  aspirin 81 MG EC tablet Take by mouth. 06/24/17   [provider]  atorvastatin (LIPITOR) 20 MG tablet Take 20 mg by mouth daily.    [provider]  busPIRone (BUSPAR) 10 MG tablet Take 10 mg by mouth 3 (three) times daily.    [provider]  citalopram (CELEXA) 20 MG tablet Take 20 mg by mouth  daily.    [provider]  dicyclomine (BENTYL) 10 MG capsule Take 10 mg by mouth 4 (four) times daily -  before meals and at bedtime.    [provider]  ferrous sulfate 325 (65 FE) MG tablet TAKE 1 TABLET(325 MG) BY MOUTH THREE TIMES DAILY WITH A MEAL 12/27/19   [provider]  ibuprofen (ADVIL,MOTRIN) 200 MG tablet Take 200 mg by mouth every 6 (six) hours as needed.    [provider]  isosorbide mononitrate (IMDUR) 30 MG 24 hr tablet Take by mouth. 04/09/20   [provider]  lisinopril (PRINIVIL,ZESTRIL) 10 MG tablet Take 10 mg by mouth daily.    [provider]  metoprolol (LOPRESSOR) 50 MG tablet Take 50 mg by mouth 2 (two) times daily.    [provider]  nitroGLYCERIN (NITROSTAT) 0.4 MG SL tablet Place 0.4 mg under the tongue every 5 (five) minutes as needed for chest pain.    [provider]  omeprazole (PRILOSEC) 20 MG capsule Take 20 mg by mouth daily.    [provider]  oxyCODONE (ROXICODONE) 15 MG immediate release tablet Take 15 mg by mouth every 4 (four) hours as needed for pain.    [provider]  QUEtiapine (SEROQUEL) 100 MG tablet Take 100 mg by mouth at bedtime.  [provider]  traZODone (DESYREL) 100 MG tablet Take 100 mg by mouth at bedtime as needed. 12/27/19   [provider]    Family History History reviewed. No pertinent family history.  Social History Social History   Tobacco Use   Smoking status: Every Day    Packs/day: 1.00    Types: Cigarettes   Smokeless tobacco: Never  Vaping Use   Vaping Use: Never used  Substance Use Topics   Alcohol use: Yes    Alcohol/week: 12.0 - 18.0 standard drinks    Types: 12 - 18 Cans of beer per week    Comment: 1-2 a month   Drug use: No     Allergies   Patient has no known allergies.   Review of Systems Review of Systems  Constitutional:  Negative for activity change, appetite change and fever.  Eyes:   Positive for pain and visual disturbance. Negative for photophobia, discharge, redness and itching.    Physical Exam Triage Vital Signs ED Triage Vitals  Enc Vitals Group     BP      Pulse      Resp      Temp      Temp src      SpO2      Weight      Height      Head Circumference      Peak Flow      Pain Score      Pain Loc      Pain Edu?      Excl. in [place]?    No data found.  Updated Vital Signs BP 129/78   Pulse 71   Temp 98.2 F (36.8 C) (Oral)   Resp 17   SpO2 96%   Visual Acuity Right Eye Distance:   Left Eye Distance:   Bilateral Distance:    Right Eye Near:   Left Eye Near:    Bilateral Near:     Physical Exam Vitals and nursing note reviewed.  Constitutional:      General: He is not in acute distress.    Appearance: Normal appearance. He is normal weight. He is not ill-appearing.  HENT:     Head: Normocephalic and atraumatic.  Eyes:     General: No scleral icterus.       Right eye: No discharge.        Left eye: No discharge.     Extraocular Movements: Extraocular movements intact.     Conjunctiva/sclera: Conjunctivae normal.     Pupils: Pupils are equal, round, and reactive to light.  Skin:    General: Skin is warm and dry.     Capillary Refill: Capillary refill takes less than 2 seconds.     Findings: No erythema or rash.  Neurological:     General: No focal deficit present.     Mental Status: He is alert and oriented to person, place, and time.  Psychiatric:        Mood and Affect: Mood normal.        Behavior: Behavior normal.        Thought Content: Thought content normal.        Judgment: Judgment normal.     UC Treatments / Results  Labs (all labs ordered are listed, but only abnormal results are displayed) Labs Reviewed - No data to display  EKG   Radiology No results found.  Procedures Procedures (including critical care time)  Medications Ordered in UC Medications - No  data to display  Initial Impression /  Assessment and Plan / UC Course  I have reviewed the triage vital signs and the nursing notes.  Pertinent labs & imaging results that were available during my care of the patient were reviewed by me and considered in my medical decision making (see chart for details).  Patient is a nontoxic-appearing 60 year old male here for evaluation of foreign body sensation in his left eye that started last evening.  Patient does not remember getting any thing in his eye but he states that he was walking out to the parking deck at [facility] and he felt what he thought was something going to his eye.  He told his wife this evening that he still has a foreign body sensation in his eye and he is having a little blurry vision in his left eye so they brought him in for evaluation.  Physical exam reveals no foreign body upon eversion of the upper and lower eyelids.  There is a stye in the central aspect of the lower eyelid.  The eye was stained with fluorescein dye after being anesthetized with 2 drops of tetracaine.  The eye was then examined with a Woods lamp which revealed a corneal abrasion in the inferior medial aspect of the iris.  There is no appreciable foreign body.  The stye in the central lower eyelid was easily ruptured with a cotton-tipped applicator.  Will discharge patient home on TobraDex to treat the corneal abrasion and help with inflammation.   Final Clinical Impressions(s) / UC Diagnoses   Final diagnoses:  Abrasion of left cornea, initial encounter  Hordeolum internum of left lower eyelid     Discharge Instructions      Instill 2 drops of Tobradex in your left eye 4 times a day for 7 days.  If you develop worsening pain in your left eye, or changes to your vision go to the ER for evaluation.     ED Prescriptions     Medication Sig Dispense Auth. Provider   tobramycin-dexamethasone [facility]) ophthalmic solution Place 2 drops into the left eye every 6 (six) hours. 5 mL Becky Augusta, NP      PDMP not reviewed this encounter.   Becky Augusta, NP 08/14/21 [phone]

## 2021-08-14 NOTE — ED Triage Notes (Signed)
Pt is present today with left eye problem. Pt states that it feels like something is in his eye that he noticed yesterday

## 2022-04-12 ENCOUNTER — Other Ambulatory Visit: Payer: Self-pay

## 2022-04-12 ENCOUNTER — Ambulatory Visit (INDEPENDENT_AMBULATORY_CARE_PROVIDER_SITE_OTHER): Payer: Medicare Other

## 2022-04-12 ENCOUNTER — Ambulatory Visit
Admission: EM | Admit: 2022-04-12 | Discharge: 2022-04-12 | Disposition: A | Discharge status: Home / Self Care | Payer: Medicare Other | Attending: Internal Medicine | Admitting: Internal Medicine

## 2022-04-12 DIAGNOSIS — S39012A Strain of muscle, fascia and tendon of lower back, initial encounter: Secondary | ICD-10-CM

## 2022-04-12 DIAGNOSIS — M545 Low back pain, unspecified: Secondary | ICD-10-CM | POA: Diagnosis not present

## 2022-04-12 MED ORDER — KETOROLAC TROMETHAMINE 60 MG/2ML IM SOLN
60.0000 mg | Freq: Once | INTRAMUSCULAR | Status: AC
Start: 2022-04-12 — End: 2022-04-12
  Administered 2022-04-12: 60 mg via INTRAMUSCULAR

## 2022-04-12 MED ORDER — BACLOFEN 10 MG PO TABS: 10.0000 mg | ORAL_TABLET | Freq: Three times a day (TID) | ORAL | 0 refills | Status: AC

## 2022-04-12 NOTE — ED Provider Notes (Signed)
?[facility] ? ? ? ?CSN: [id number] ?Arrival date & time: 04/12/22  0856 ? ? ?  ? ?History   ?Chief Complaint ?Chief Complaint  ?Patient presents with  ? Back Pain  ? ? ?HPI ?Derek Solis is a 61 y.o. male who presents with L lower back pain x 1 week. He had been spraying for weeks. Has tried heat which seemed to help. He first injured it 4 weeks ago when he stepped into a hole, but got better after one week. He has been working on building a fence and was spraying the gound the past few weeks. Denies hx of back pain or this type pain before. Pain is provoked trying to straighten his back, bending over or coughing. The pain does not radiate. Denies urinary symptoms or paresthesia.  ? ? ? ?Past Medical History:  ?Diagnosis Date  ? Coronary artery disease   ? High cholesterol   ? Hypertension   ? MI (mitral incompetence)   ? ? ?There are no problems to display for this patient. ? ? ?Past Surgical History:  ?Procedure Laterality Date  ? CORONARY ANGIOPLASTY    ? CORONARY ANGIOPLASTY WITH STENT PLACEMENT    ? ? ? ? ? ?Home Medications   ? ?Prior to Admission medications   ?Medication Sig Start Date End Date Taking? Authorizing Provider  ?baclofen (LIORESAL) 10 MG tablet Take 1 tablet (10 mg total) by mouth 3 (three) times daily. 04/12/22  Yes Rodriguez-Southworth, Nettie ElmSylvia, PA-C  ?albuterol (PROVENTIL HFA;VENTOLIN HFA) 108 (90 BASE) MCG/ACT inhaler Inhale into the lungs every 6 (six) hours as needed for wheezing or shortness of breath.    [provider]  ?aspirin 81 MG EC tablet Take by mouth. 06/24/17   [provider]  ?atorvastatin (LIPITOR) 20 MG tablet Take 20 mg by mouth daily.    [provider]  ?busPIRone (BUSPAR) 10 MG tablet Take 10 mg by mouth 3 (three) times daily.    [provider]  ?citalopram (CELEXA) 20 MG tablet Take 20 mg by mouth daily.    [provider]  ?ferrous sulfate 325 (65 FE) MG tablet TAKE 1 TABLET(325 MG) BY MOUTH THREE TIMES DAILY  WITH A MEAL 12/27/19   [provider]  ?ibuprofen (ADVIL,MOTRIN) 200 MG tablet Take 200 mg by mouth every 6 (six) hours as needed.    [provider]  ?isosorbide mononitrate (IMDUR) 30 MG 24 hr tablet Take by mouth. 04/09/20   [provider]  ?lisinopril (PRINIVIL,ZESTRIL) 10 MG tablet Take 10 mg by mouth daily.    [provider]  ?metoprolol (LOPRESSOR) 50 MG tablet Take 50 mg by mouth 2 (two) times daily.    [provider]  ?nitroGLYCERIN (NITROSTAT) 0.4 MG SL tablet Place 0.4 mg under the tongue every 5 (five) minutes as needed for chest pain.    [provider]  ?omeprazole (PRILOSEC) 20 MG capsule Take 20 mg by mouth daily.    [provider]  ?QUEtiapine (SEROQUEL) 100 MG tablet Take 100 mg by mouth at bedtime.    [provider]  ?traZODone (DESYREL) 100 MG tablet Take 100 mg by mouth at bedtime as needed. 12/27/19   [provider]  ? ? ?Family History ?History reviewed. No pertinent family history. ? ?Social History ?Social History  ? ?Tobacco Use  ? Smoking status: Every Day  ?  Packs/day: 1.00  ?  Types: Cigarettes  ? Smokeless tobacco: Never  ?Vaping Use  ? Vaping Use:  Never used  ?Substance Use Topics  ? Alcohol use: Not Currently  ?  Alcohol/week: 12.0 - 18.0 standard drinks  ?  Types: 12 - 18 Cans of beer per week  ?  Comment: 1-2 a month  ? Drug use: No  ? ? ? ?Allergies   ?Alpha-gal ? ? ?Review of Systems ?Review of Systems  ?Constitutional:  Negative for chills, diaphoresis and fever.  ?Gastrointestinal:  Negative for abdominal pain.  ?Genitourinary:  Negative for difficulty urinating, dysuria and frequency.  ?     Denies hesitation  ?Musculoskeletal:  Positive for back pain.  ?Skin:  Negative for rash.  ?Neurological:  Negative for weakness and numbness.  ? ? ?Physical Exam ?Triage Vital Signs ?ED Triage Vitals  ?Enc Vitals Group  ?   BP 04/12/22 0920 135/71  ?   Pulse Rate 04/12/22 0920 67  ?   Resp 04/12/22 0920 16   ?   Temp 04/12/22 0920 97.8 ?F (36.6 ?C)  ?   Temp Source 04/12/22 0920 Oral  ?   SpO2 04/12/22 0920 100 %  ?   Weight 04/12/22 0918 195 lb (88.5 kg)  ?   Height 04/12/22 0918 5\' 11"  (1.803 m)  ?   Head Circumference --   ?   Peak Flow --   ?   Pain Score 04/12/22 0917 8  ?   Pain Loc --   ?   Pain Edu? --   ?   Excl. in [place]? --   ? ?No data found. ? ?Updated Vital Signs ?BP 135/71 (BP Location: Right Arm)   Pulse 67   Temp 97.8 ?F (36.6 ?C) (Oral)   Resp 16   Ht 5\' 11"  (1.803 m)   Wt 195 lb (88.5 kg)   SpO2 100%   BMI 27.20 kg/m?  ? ?Visual Acuity ?Right Eye Distance:   ?Left Eye Distance:   ?Bilateral Distance:   ? ?Right Eye Near:   ?Left Eye Near:    ?Bilateral Near:    ? ?Physical Exam ?Vitals and nursing note reviewed.  ?Constitutional:   ?   General: He is not in acute distress. ?   Appearance: He is normal weight. He is not toxic-appearing.  ?HENT:  ?   Right Ear: External ear normal.  ?   Left Ear: External ear normal.  ?Eyes:  ?   General: No scleral icterus. ?   Conjunctiva/sclera: Conjunctivae normal.  ?Abdominal:  ?   General: Abdomen is flat. Bowel sounds are normal. There is no distension.  ?   Palpations: Abdomen is soft. There is no mass.  ?   Tenderness: There is no abdominal tenderness.  ?   Comments: No pulsatile mass, or bruits heard. Has well healed central vertical scar  ?Musculoskeletal:     ?   General: Normal range of motion.  ?   Cervical back: Neck supple.  ?   Comments: BACK- no scoliosis or kyphosis present. Does not have tenderness on lower central lumbar region with palpation, but was in pain when he stood up from sitting and could not straighten his back fully due to pain. Lateral flexion provoked the pain as well. No pain present with thoracic rotation.   ?Skin: ?   General: Skin is warm and dry.  ?   Findings: No rash.  ?Neurological:  ?   Mental Status: He is alert and oriented to person, place, and time.  ?   Motor: No weakness.  ?   Gait:  Gait abnormal.  ?   Deep Tendon  Reflexes: Reflexes normal.  ?   Comments: Neg SLR  ?Psychiatric:     ?   Mood and Affect: Mood normal.     ?   Behavior: Behavior normal.     ?   Thought Content: Thought content normal.     ?   Judgment: Judgment normal.  ? ? ? ?UC Treatments / Results  ?Labs ?(all labs ordered are listed, but only abnormal results are displayed) ?Labs Reviewed - No data to display ? ?EKG ? ? ?Radiology ?DG Lumbar Spine Complete ? ?Result Date: 04/12/2022 ?CLINICAL DATA:  Low back pain. EXAM: LUMBAR SPINE - COMPLETE 4+ VIEW COMPARISON:  None Available. FINDINGS: Four view study. There is no evidence of lumbar spine fracture. Alignment is normal. Intervertebral disc spaces are maintained. Atherosclerotic calcification noted in the wall of the abdominal aorta. IMPRESSION: No acute bony abnormality. Aortic Atherosclerois (ICD10-170.0) Electronically Signed   By: Kennith Center M.D.   On: 04/12/2022 10:24   ? ?Procedures ?Procedures (including critical care time) ? ?Medications Ordered in UC ?Medications  ?ketorolac (TORADOL) injection 60 mg (60 mg Intramuscular Given 04/12/22 1059)  ? ? ?Initial Impression / Assessment and Plan / UC Course  ?I have reviewed the triage vital signs and the nursing notes. ? ?Pertinent  imaging results that were available during my care of the patient were reviewed by me and considered in my medical decision making (see chart for details). ? ?Lumbar strain ?He was given Toradol 60 mg IM ? ?I sent rx for Baclofen to his pharmacy to try.  ?Needs to Fu with ortho if pain persists. ? ? ?Final Clinical Impressions(s) / UC Diagnoses  ? ?Final diagnoses:  ?Strain of lumbar region, initial encounter  ? ? ? ?Discharge Instructions   ? ?  ?Follow up with your cardiologist about the aorta calcifications ?Follow up with orthopedics if the medication does not get better.  ? ? ? ? ?ED Prescriptions   ? ? Medication Sig Dispense Auth. Provider  ? baclofen (LIORESAL) 10 MG tablet Take 1 tablet (10 mg total) by mouth 3 (three)  times daily. 30 each Rodriguez-Southworth, Nettie Elm, PA-C  ? ?  ? ?I have reviewed the PDMP during this encounter. ?  ?Garey Ham, PA-C ?04/12/22 1208 ? ?

## 2022-04-12 NOTE — Discharge Instructions (Addendum)
Follow up with your cardiologist about the aorta calcifications ?Follow up with orthopedics if the medication does not get better.  ?

## 2022-04-12 NOTE — ED Triage Notes (Addendum)
Pt states he pulled his left lower back a week ago while spraying for weeds. Heat seems to help ?

## 2022-08-29 IMAGING — CR DG LUMBAR SPINE COMPLETE 4+V
5 series · 5 of 5 positions shown · non-contrast
Comparison: None Available.

CLINICAL DATA: Low back pain.

EXAM:
LUMBAR SPINE - COMPLETE 4+ VIEW

[l-spine ap]
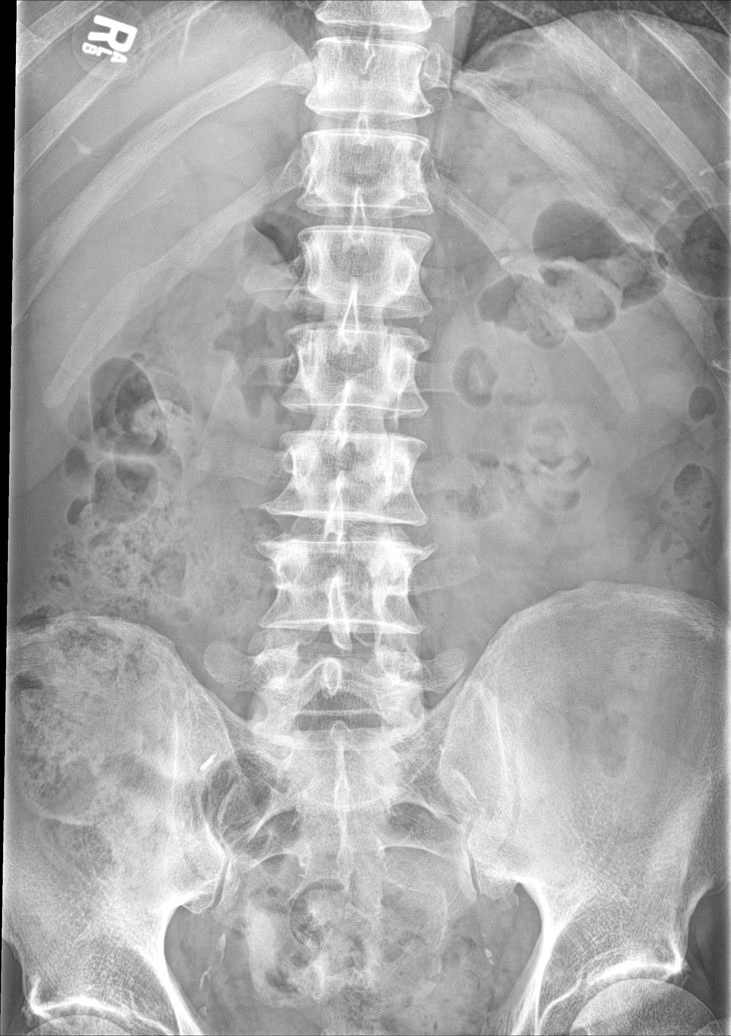

[l-spine obl (1 of 2)]
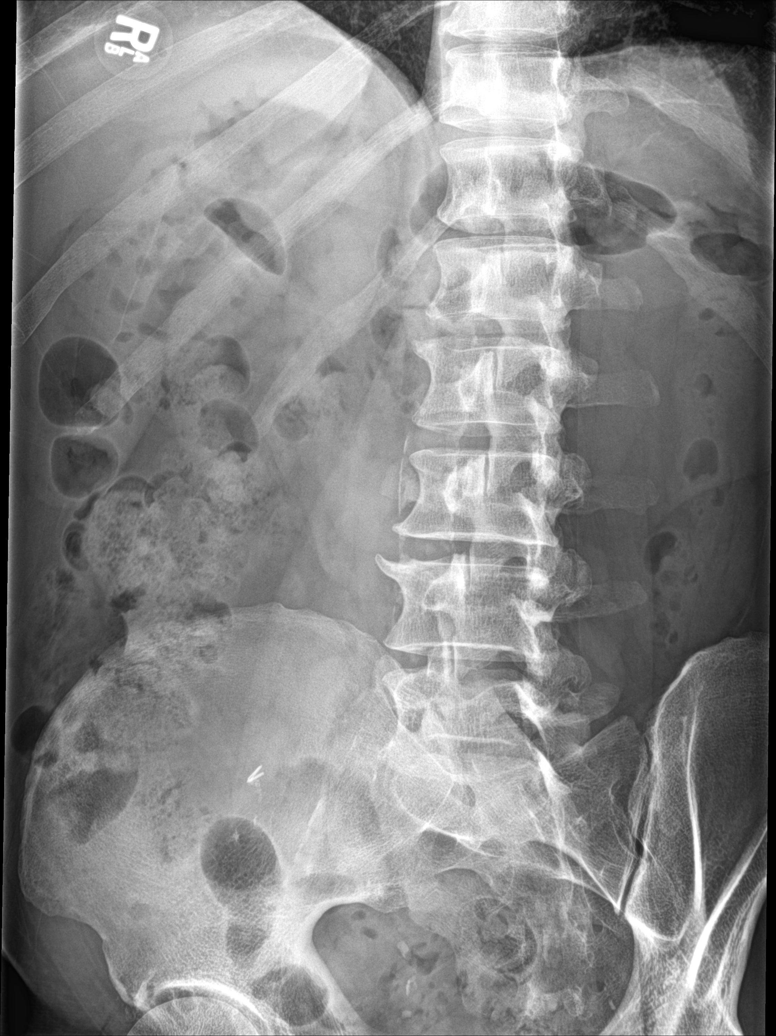

[l-spine obl (2 of 2)]
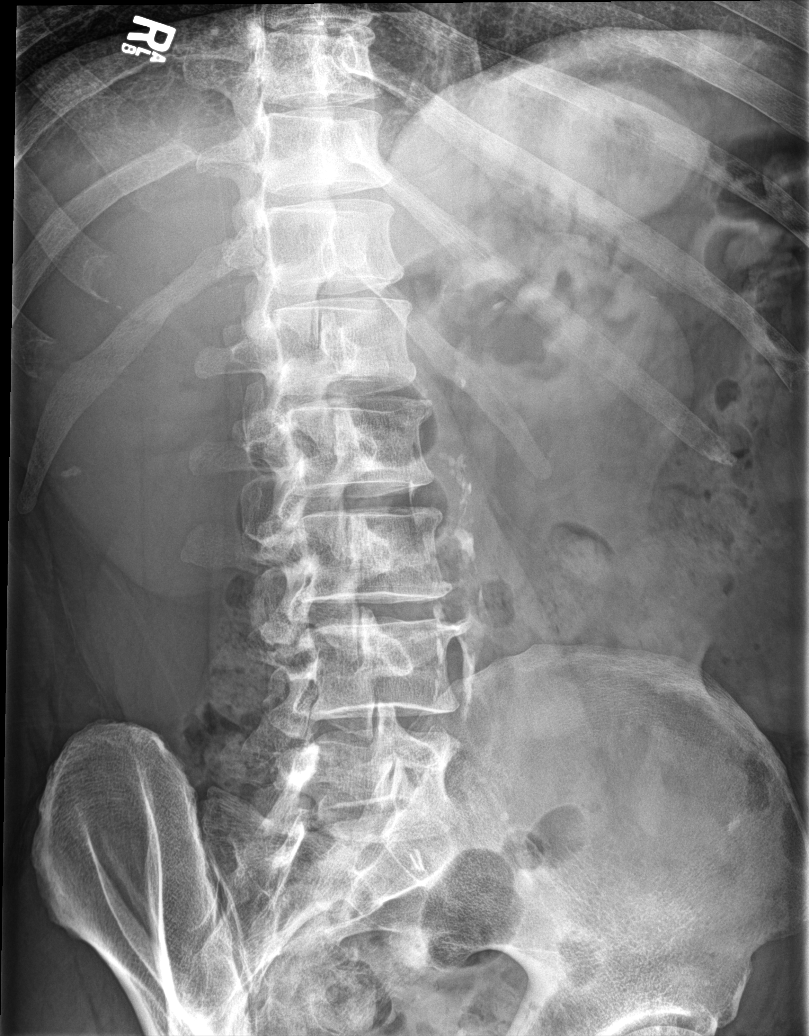

[l-spine lat]
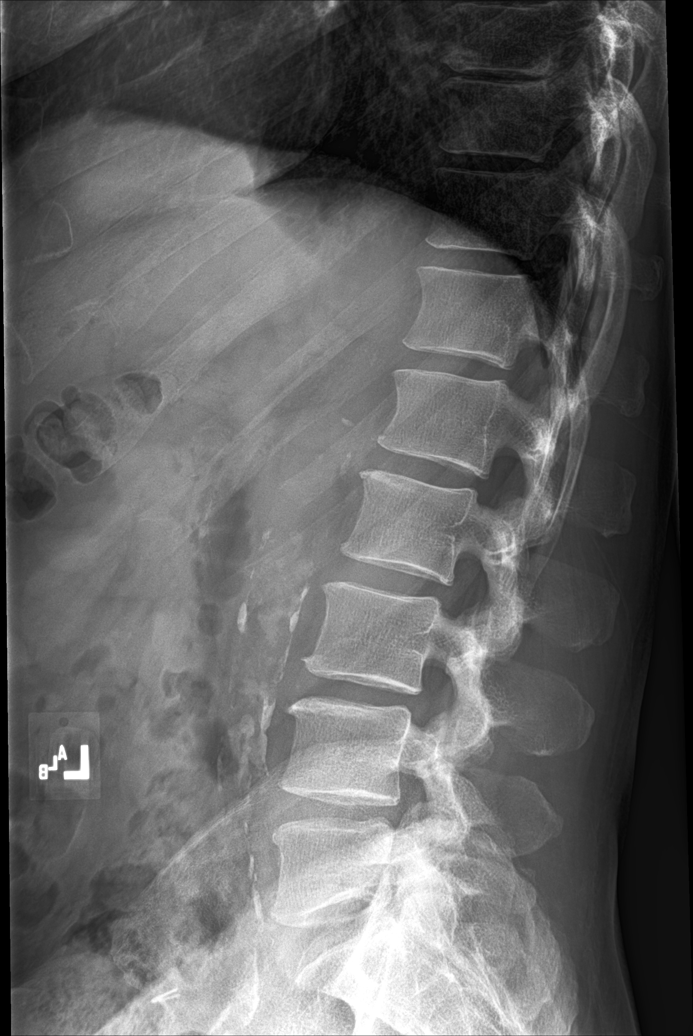

[l-spine spot]
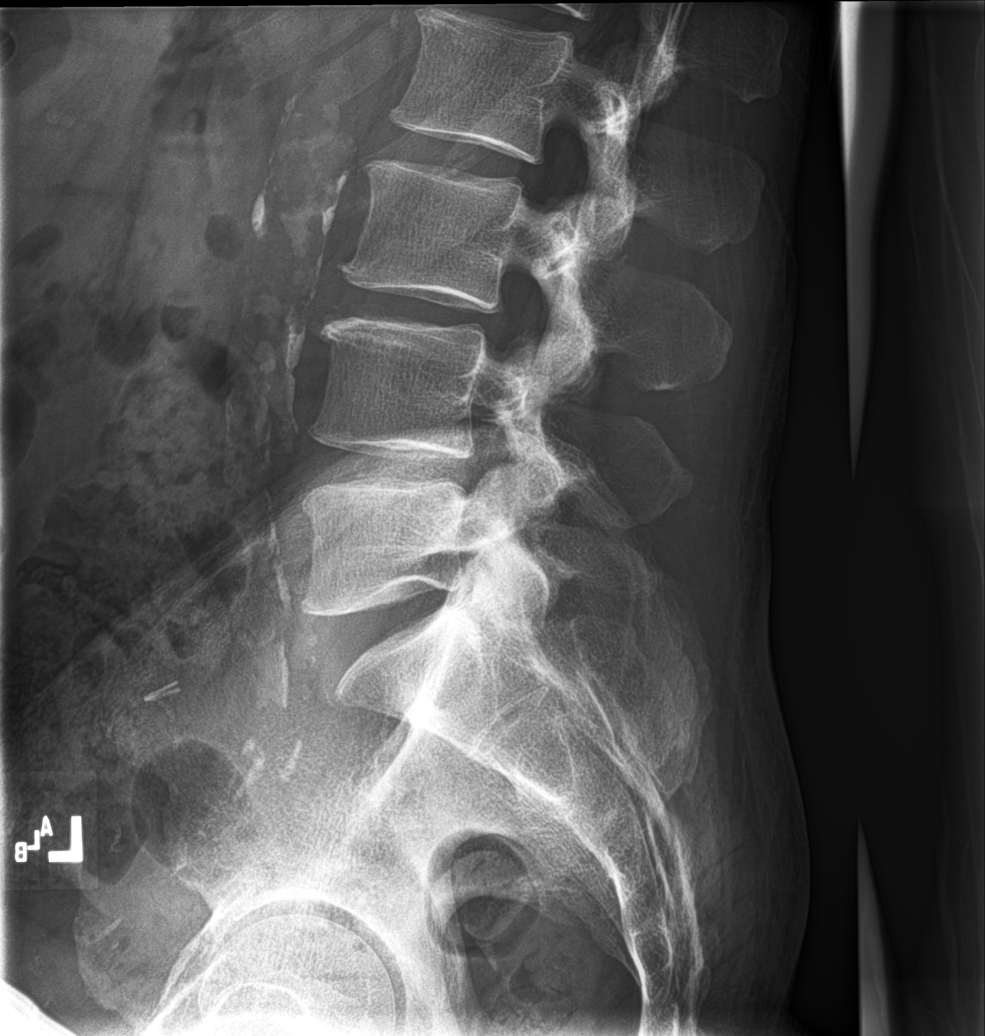

[5 of 5 positions shown; findings below may reference images not displayed]

FINDINGS: Four view study. There is no evidence of lumbar spine fracture.
Alignment is normal. Intervertebral disc spaces are maintained.
Atherosclerotic calcification noted in the wall of the abdominal
aorta.
IMPRESSION: No acute bony abnormality.

Aortic Atherosclerois ([id number]-170.0)

## 2022-09-17 ENCOUNTER — Ambulatory Visit
Admission: EM | Admit: 2022-09-17 | Discharge: 2022-09-17 | Disposition: A | Discharge status: Home / Self Care | Payer: Medicare Other | Attending: Physician Assistant | Admitting: Physician Assistant

## 2022-09-17 DIAGNOSIS — U071 COVID-19: Secondary | ICD-10-CM | POA: Diagnosis present

## 2022-09-17 DIAGNOSIS — Z20822 Contact with and (suspected) exposure to covid-19: Secondary | ICD-10-CM | POA: Diagnosis present

## 2022-09-17 DIAGNOSIS — R051 Acute cough: Secondary | ICD-10-CM | POA: Diagnosis present

## 2022-09-17 LAB — SARS CORONAVIRUS 2 BY RT PCR: SARS Coronavirus 2 by RT PCR: POSITIVE — AB

## 2022-09-17 MED ORDER — MOLNUPIRAVIR 200 MG PO CAPS: 4.0000 | ORAL_CAPSULE | Freq: Two times a day (BID) | ORAL | 0 refills | Status: AC

## 2022-09-17 MED ORDER — PROMETHAZINE-DM 6.25-15 MG/5ML PO SYRP: 5.0000 mL | ORAL_SOLUTION | Freq: Four times a day (QID) | ORAL | 0 refills | Status: DC | PRN

## 2022-09-17 NOTE — ED Triage Notes (Signed)
Pt presents with COVID exposure.  (Wife positive.) Reports sinus drainage, feeling flushed, fatigue, HA, body aches, light headed starting last night. Hasn't taken any meds today.

## 2022-09-17 NOTE — Discharge Instructions (Signed)
-  Your COVID test was positive.  You need to isolate 5 days and wear mask for 5 days from symptom onset.  First day of symptoms is today so that means you need to isolate until Wednesday and then wear a mask for 5 days after that. - I did send antiviral medication to the pharmacy to start that ASAP.  I also sent cough medication.  Increase your rest and fluids.  Tylenol as needed for headaches. - You should be feeling better over the next 7 to 10 days but if you feel worse or have shortness of breath, uncontrolled fevers or weakness, pain in your chest or breathing difficulty you need to be seen again right away.  Go to ER for any severe symptoms or return here as needed.

## 2022-09-17 NOTE — ED Provider Notes (Signed)
[facility]    CSN: [id number] Arrival date & time: 09/17/22  0846      History   Chief Complaint Chief Complaint  Patient presents with   Fever    HPI Derek Solis is a 61 y.o. male with history of coronary artery disease, bipolar disorder, hyperlipidemia, hypertension and mitral valve incompetence.  Patient presents today for symptoms following COVID exposure to his wife.  He states that yesterday he developed fatigue, body aches, headaches, cough, congestion.  He says "I feel so bad."  He believes he has COVID.  He has not performed a home test.  He is not taking any medicine for symptoms.  Reports a history of COVID-19 in January of this year.  Patient says he was sick for about a week and was unable to receive any antiviral medication at that time so that is why he came early this time.  He has no other complaints.  HPI  Past Medical History:  Diagnosis Date   Coronary artery disease    High cholesterol    Hypertension    MI (mitral incompetence)     There are no problems to display for this patient.   Past Surgical History:  Procedure Laterality Date   CORONARY ANGIOPLASTY     CORONARY ANGIOPLASTY WITH STENT PLACEMENT         Home Medications    Prior to Admission medications   Medication Sig Start Date End Date Taking? Authorizing Provider  albuterol (PROVENTIL HFA;VENTOLIN HFA) 108 (90 BASE) MCG/ACT inhaler Inhale into the lungs every 6 (six) hours as needed for wheezing or shortness of breath.   Yes [provider]  aspirin 81 MG EC tablet Take by mouth. 06/24/17  Yes [provider]  atorvastatin (LIPITOR) 20 MG tablet Take 20 mg by mouth daily.   Yes [provider]  baclofen (LIORESAL) 10 MG tablet Take 1 tablet (10 mg total) by mouth 3 (three) times daily. 04/12/22  Yes Rodriguez-Southworth, Nettie Elm, PA-C  busPIRone (BUSPAR) 10 MG tablet Take 10 mg by mouth 3 (three) times daily.   Yes [provider]   citalopram (CELEXA) 20 MG tablet Take 20 mg by mouth daily.   Yes [provider]  ferrous sulfate 325 (65 FE) MG tablet TAKE 1 TABLET(325 MG) BY MOUTH THREE TIMES DAILY WITH A MEAL 12/27/19  Yes [provider]  metoprolol (LOPRESSOR) 50 MG tablet Take 50 mg by mouth 2 (two) times daily.   Yes [provider]  molnupiravir EUA (LAGEVRIO) 200 MG CAPS capsule Take 4 capsules (800 mg total) by mouth 2 (two) times daily for 5 days. 09/17/22 09/22/22 Yes Shirlee Latch, PA-C  omeprazole (PRILOSEC) 20 MG capsule Take 20 mg by mouth daily.   Yes [provider]  promethazine-dextromethorphan (PROMETHAZINE-DM) 6.25-15 MG/5ML syrup Take 5 mLs by mouth 4 (four) times daily as needed. 09/17/22  Yes Eusebio Friendly B, PA-C  QUEtiapine (SEROQUEL) 100 MG tablet Take 100 mg by mouth at bedtime.   Yes [provider]  traZODone (DESYREL) 100 MG tablet Take 100 mg by mouth at bedtime as needed. 12/27/19  Yes [provider]  ibuprofen (ADVIL,MOTRIN) 200 MG tablet Take 200 mg by mouth every 6 (six) hours as needed.    [provider]  isosorbide mononitrate (IMDUR) 30 MG 24 hr tablet Take by mouth. 04/09/20   [provider]  lisinopril (PRINIVIL,ZESTRIL) 10 MG tablet Take 10 mg by mouth daily.    [provider]  nitroGLYCERIN (NITROSTAT) 0.4 MG SL tablet Place 0.4 mg under the tongue every 5 (five) minutes as needed for chest pain.    [provider]    Family History No family history on file.  Social History Social History   Tobacco Use   Smoking status: Every Day    Packs/day: 1.00    Types: Cigarettes   Smokeless tobacco: Never  Vaping Use   Vaping Use: Never used  Substance Use Topics   Alcohol use: Not Currently    Alcohol/week: 12.0 - 18.0 standard drinks of alcohol    Types: 12 - 18 Cans of beer per week    Comment: 1-2 a month   Drug use: No     Allergies   Alpha-gal   Review of Systems Review of  Systems  Constitutional:  Positive for fatigue. Negative for fever.  HENT:  Positive for congestion and rhinorrhea. Negative for sinus pressure, sinus pain and sore throat.   Respiratory:  Positive for cough. Negative for shortness of breath.   Cardiovascular:  Negative for chest pain.  Gastrointestinal:  Negative for abdominal pain, diarrhea, nausea and vomiting.  Musculoskeletal:  Positive for myalgias.  Neurological:  Positive for headaches. Negative for weakness and light-headedness.  Hematological:  Negative for adenopathy.     Physical Exam Triage Vital Signs ED Triage Vitals  Enc Vitals Group     BP 09/17/22 0858 134/71     Pulse Rate 09/17/22 0858 86     Resp 09/17/22 0858 20     Temp 09/17/22 0858 99.2 F (37.3 C)     Temp Source 09/17/22 0858 Oral     SpO2 09/17/22 0858 96 %     Weight --      Height --      Head Circumference --      Peak Flow --      Pain Score 09/17/22 0857 0     Pain Loc --      Pain Edu? --      Excl. in [place]? --    No data found.  Updated Vital Signs BP 134/71 (BP Location: Left Arm)   Pulse 86   Temp 99.2 F (37.3 C) (Oral)   Resp 20   SpO2 96%      Physical Exam Vitals and nursing note reviewed.  Constitutional:      General: He is not in acute distress.    Appearance: Normal appearance. He is well-developed. He is ill-appearing.  HENT:     Head: Normocephalic and atraumatic.     Nose: Congestion present.     Mouth/Throat:     Mouth: Mucous membranes are moist.     Pharynx: Oropharynx is clear.  Eyes:     General: No scleral icterus.    Conjunctiva/sclera: Conjunctivae normal.  Cardiovascular:     Rate and Rhythm: Normal rate and regular rhythm.     Heart sounds: Normal heart sounds.  Pulmonary:     Effort: Pulmonary effort is normal. No respiratory distress.     Breath sounds: Normal breath sounds. No wheezing, rhonchi or rales.  Musculoskeletal:     Cervical back: Neck supple.  Skin:    General: Skin is warm and dry.      Capillary Refill: Capillary refill takes less than 2 seconds.  Neurological:     General: No focal deficit present.     Mental Status: He is alert. Mental status is at baseline.     Motor: No weakness.  Gait: Gait normal.  Psychiatric:        Mood and Affect: Mood normal.        Behavior: Behavior normal.      UC Treatments / Results  Labs (all labs ordered are listed, but only abnormal results are displayed) Labs Reviewed  SARS CORONAVIRUS 2 BY RT PCR - Abnormal; Notable for the following components:      Result Value   SARS Coronavirus 2 by RT PCR POSITIVE (*)    All other components within normal limits    EKG   Radiology No results found.  Procedures Procedures (including critical care time)  Medications Ordered in UC Medications - No data to display  Initial Impression / Assessment and Plan / UC Course  I have reviewed the triage vital signs and the nursing notes.  Pertinent labs & imaging results that were available during my care of the patient were reviewed by me and considered in my medical decision making (see chart for details).   61 year old male with history of hypertension, bipolar disorder presents for fatigue, body aches, cough, congestion following exposure to COVID-19 by his wife.  Vitals are all normal and stable.  He is ill-appearing but nontoxic.  On exam he has nasal congestion.  Throat is clear.  Chest clear to station heart regular rate rhythm.  PCR COVID test performed and positive.  Reviewed current CDC guidelines, isolation protocol and ED precautions with patient.  We will treat at this time with molnupiravir and I also prescribed Promethazine DM.  Encouraged plenty of rest and fluids.  Advised going to ED if chest pain, breathing difficulty, weakness, uncontrollable fever, etc.  Follow-up here as needed.   Final Clinical Impressions(s) / UC Diagnoses   Final diagnoses:  COVID-19  Acute cough  Close exposure to COVID-19 virus      Discharge Instructions      -Your COVID test was positive.  You need to isolate 5 days and wear mask for 5 days from symptom onset.  First day of symptoms is today so that means you need to isolate until Wednesday and then wear a mask for 5 days after that. - I did send antiviral medication to the pharmacy to start that ASAP.  I also sent cough medication.  Increase your rest and fluids.  Tylenol as needed for headaches. - You should be feeling better over the next 7 to 10 days but if you feel worse or have shortness of breath, uncontrolled fevers or weakness, pain in your chest or breathing difficulty you need to be seen again right away.  Go to ER for any severe symptoms or return here as needed.   ED Prescriptions     Medication Sig Dispense Auth. Provider   molnupiravir EUA (LAGEVRIO) 200 MG CAPS capsule Take 4 capsules (800 mg total) by mouth 2 (two) times daily for 5 days. 40 capsule Shirlee Latch, PA-C   promethazine-dextromethorphan (PROMETHAZINE-DM) 6.25-15 MG/5ML syrup Take 5 mLs by mouth 4 (four) times daily as needed. 118 mL Shirlee Latch, PA-C      PDMP not reviewed this encounter.   Shirlee Latch, PA-C 09/17/22 1021

## 2022-11-30 ENCOUNTER — Ambulatory Visit
Admission: EM | Admit: 2022-11-30 | Discharge: 2022-11-30 | Disposition: A | Discharge status: Home / Self Care | Payer: Medicare Other | Attending: Internal Medicine | Admitting: Internal Medicine

## 2022-11-30 ENCOUNTER — Encounter: Payer: Self-pay | Admitting: Internal Medicine

## 2022-11-30 DIAGNOSIS — K5289 Other specified noninfective gastroenteritis and colitis: Secondary | ICD-10-CM | POA: Diagnosis present

## 2022-11-30 LAB — COMPREHENSIVE METABOLIC PANEL
ALT: 43 U/L (ref 0–44)
AST: 36 U/L (ref 15–41)
Albumin: 4.3 g/dL (ref 3.5–5.0)
Alkaline Phosphatase: 108 U/L (ref 38–126)
Anion gap: 4 — ABNORMAL LOW (ref 5–15)
BUN: 15 mg/dL (ref 8–23)
CO2: 27 mmol/L (ref 22–32)
Calcium: 8.5 mg/dL — ABNORMAL LOW (ref 8.9–10.3)
Chloride: 107 mmol/L (ref 98–111)
Creatinine, Ser: 1.28 mg/dL — ABNORMAL HIGH (ref 0.61–1.24)
GFR, Estimated: >60 mL/min (ref >60)
Glucose, Bld: 111 mg/dL — ABNORMAL HIGH (ref 70–99)
Potassium: 4.1 mmol/L (ref 3.5–5.1)
Sodium: 138 mmol/L (ref 135–145)
Total Bilirubin: 0.6 mg/dL (ref 0.3–1.2)
Total Protein: 7.3 g/dL (ref 6.5–8.1)

## 2022-11-30 LAB — CBC WITH DIFFERENTIAL/PLATELET
Abs Immature Granulocytes: 0.04 10*3/uL (ref 0.00–0.07)
Basophils Absolute: 0.1 10*3/uL (ref 0.0–0.1)
Basophils Relative: 1 %
Eosinophils Absolute: 0.2 10*3/uL (ref 0.0–0.5)
Eosinophils Relative: 2 %
HCT: 43.6 % (ref 39.0–52.0)
Hemoglobin: 15.4 g/dL (ref 13.0–17.0)
Immature Granulocytes: 1 %
Lymphocytes Relative: 29 %
Lymphs Abs: 2 10*3/uL (ref 0.7–4.0)
MCH: 32.3 pg (ref 26.0–34.0)
MCHC: 35.3 g/dL (ref 30.0–36.0)
MCV: 91.4 fL (ref 80.0–100.0)
Monocytes Absolute: 0.4 10*3/uL (ref 0.1–1.0)
Monocytes Relative: 6 %
Neutro Abs: 4 10*3/uL (ref 1.7–7.7)
Neutrophils Relative %: 61 %
Platelets: 219 10*3/uL (ref 150–400)
RBC: 4.77 MIL/uL (ref 4.22–5.81)
RDW: 11.8 % (ref 11.5–15.5)
WBC: 6.6 10*3/uL (ref 4.0–10.5)
nRBC: 0 % (ref 0.0–0.2)

## 2022-11-30 MED ORDER — ONDANSETRON 8 MG PO TBDP: 8.0000 mg | ORAL_TABLET | Freq: Three times a day (TID) | ORAL | 0 refills | Status: AC | PRN

## 2022-11-30 MED ORDER — ONDANSETRON 8 MG PO TBDP
8.0000 mg | ORAL_TABLET | Freq: Once | ORAL | Status: AC
  Administered 2022-11-30: 8 mg via ORAL

## 2022-11-30 MED ORDER — DICYCLOMINE HCL 20 MG PO TABS: 20.0000 mg | ORAL_TABLET | Freq: Two times a day (BID) | ORAL | 0 refills | Status: AC

## 2022-11-30 NOTE — ED Provider Notes (Signed)
[facility]    CSN: [id number] Arrival date & time: 11/30/22  0806      History   Chief Complaint Chief Complaint  Patient presents with   Emesis   Diarrhea    HPI Derek Solis is a 61 y.o. male who presents due to onset of diarrhea x 3 days. Today he has been worse and woke up at 1 am, and diarrhea is worse and had 2 accidents on himself since he could not make it to the bathroom. Started with diarrhea 3 days ago, and the next day stayed in bed. He thought he was better yesterday, and ate mack and cheese for dinner last night. Abdominal pain comes when has to have a BM and improves. Last night vomited once, none since or before.  His grandkids started this  His wife got this first one week ago. He has had 2 doses of imodium this am.     Past Medical History:  Diagnosis Date   Coronary artery disease    High cholesterol    Hypertension    MI (mitral incompetence)     There are no problems to display for this patient.   Past Surgical History:  Procedure Laterality Date   CORONARY ANGIOPLASTY     CORONARY ANGIOPLASTY WITH STENT PLACEMENT         Home Medications    Prior to Admission medications   Medication Sig Start Date End Date Taking? Authorizing Provider  albuterol (PROVENTIL HFA;VENTOLIN HFA) 108 (90 BASE) MCG/ACT inhaler Inhale into the lungs every 6 (six) hours as needed for wheezing or shortness of breath.   Yes [provider]  aspirin 81 MG EC tablet Take by mouth. 06/24/17  Yes [provider]  atorvastatin (LIPITOR) 20 MG tablet Take 20 mg by mouth daily.   Yes [provider]  baclofen (LIORESAL) 10 MG tablet Take 1 tablet (10 mg total) by mouth 3 (three) times daily. 04/12/22  Yes Rodriguez-Southworth, Sunday Spillers, PA-C  busPIRone (BUSPAR) 10 MG tablet Take 10 mg by mouth 3 (three) times daily.   Yes [provider]  citalopram (CELEXA) 20 MG tablet Take 20 mg by mouth daily.   Yes [provider]   dicyclomine (BENTYL) 20 MG tablet Take 1 tablet (20 mg total) by mouth 2 (two) times daily. 11/30/22  Yes Rodriguez-Southworth, Sunday Spillers, PA-C  ferrous sulfate 325 (65 FE) MG tablet TAKE 1 TABLET(325 MG) BY MOUTH THREE TIMES DAILY WITH A MEAL 12/27/19  Yes [provider]  ibuprofen (ADVIL,MOTRIN) 200 MG tablet Take 200 mg by mouth every 6 (six) hours as needed.   Yes [provider]  isosorbide mononitrate (IMDUR) 30 MG 24 hr tablet Take by mouth. 04/09/20  Yes [provider]  lisinopril (PRINIVIL,ZESTRIL) 10 MG tablet Take 10 mg by mouth daily.   Yes [provider]  metoprolol (LOPRESSOR) 50 MG tablet Take 50 mg by mouth 2 (two) times daily.   Yes [provider]  nitroGLYCERIN (NITROSTAT) 0.4 MG SL tablet Place 0.4 mg under the tongue every 5 (five) minutes as needed for chest pain.   Yes [provider]  omeprazole (PRILOSEC) 20 MG capsule Take 20 mg by mouth daily.   Yes [provider]  ondansetron (ZOFRAN-ODT) 8 MG disintegrating tablet Take 1 tablet (8 mg total) by mouth every 8 (eight) hours as needed for nausea or vomiting. 11/30/22  Yes Rodriguez-Southworth, Sunday Spillers, PA-C  QUEtiapine (SEROQUEL) 100 MG tablet Take 100 mg by mouth at  bedtime.   Yes [provider]  traZODone (DESYREL) 100 MG tablet Take 100 mg by mouth at bedtime as needed. 12/27/19  Yes [provider]    Family History History reviewed. No pertinent family history.  Social History Social History   Tobacco Use   Smoking status: Every Day    Packs/day: 1.00    Types: Cigarettes   Smokeless tobacco: Never  Vaping Use   Vaping Use: Never used  Substance Use Topics   Alcohol use: Not Currently    Alcohol/week: 12.0 - 18.0 standard drinks of alcohol    Types: 12 - 18 Cans of beer per week    Comment: 1-2 a month   Drug use: No     Allergies   Alpha-gal   Review of Systems Review of Systems  Constitutional:  Positive for  appetite change and fatigue. Negative for chills, diaphoresis and fever.  HENT:  Negative for congestion.   Eyes:  Negative for discharge.  Respiratory:  Negative for cough.   Gastrointestinal:  Positive for abdominal pain, diarrhea, nausea and vomiting. Negative for blood in stool.     Physical Exam Triage Vital Signs ED Triage Vitals  Enc Vitals Group     BP 11/30/22 0824 (!) 149/76     Pulse Rate 11/30/22 0824 66     Resp 11/30/22 0824 16     Temp 11/30/22 0824 (!) 97.5 F (36.4 C)     Temp Source 11/30/22 0824 Oral     SpO2 11/30/22 0824 96 %     Weight 11/30/22 0820 200 lb (90.7 kg)     Height 11/30/22 0820 5\' 11"  (1.803 m)     Head Circumference --      Peak Flow --      Pain Score 11/30/22 0819 0     Pain Loc --      Pain Edu? --      Excl. in [place]? --    No data found.  Updated Vital Signs BP (!) 149/76 (BP Location: Left Arm)   Pulse 66   Temp (!) 97.5 F (36.4 C) (Oral)   Resp 16   Ht 5\' 11"  (1.803 m)   Wt 200 lb (90.7 kg)   SpO2 96%   BMI 27.89 kg/m   Visual Acuity Right Eye Distance:   Left Eye Distance:   Bilateral Distance:    Right Eye Near:   Left Eye Near:    Bilateral Near:     Physical Exam Constitutional:      General: He is not in acute distress.    Appearance: He is not ill-appearing, toxic-appearing or diaphoretic.  HENT:     Right Ear: External ear normal.     Left Ear: External ear normal.     Mouth/Throat:     Mouth: Mucous membranes are moist.  Eyes:     General: No scleral icterus.    Conjunctiva/sclera: Conjunctivae normal.  Pulmonary:     Effort: Pulmonary effort is normal.     Breath sounds: Normal breath sounds.  Abdominal:     General: There is no distension.     Palpations: Abdomen is soft. There is no mass.     Tenderness: There is no abdominal tenderness. There is no guarding or rebound.  Musculoskeletal:     Cervical back: Neck supple.  Skin:    General: Skin is warm and dry.  Neurological:     Mental  Status: He is alert and oriented to person, place,  and time.     Gait: Gait normal.  Psychiatric:        Mood and Affect: Mood normal.        Behavior: Behavior normal.        Thought Content: Thought content normal.        Judgment: Judgment normal.      UC Treatments / Results  Labs (all labs ordered are listed, but only abnormal results are displayed) Labs Reviewed  COMPREHENSIVE METABOLIC PANEL - Abnormal; Notable for the following components:      Result Value   Glucose, Bld 111 (*)    Creatinine, Ser 1.28 (*)    Calcium 8.5 (*)    Anion gap 4 (*)    All other components within normal limits  CBC WITH DIFFERENTIAL/PLATELET    EKG   Radiology No results found.  Procedures Procedures (including critical care time)  Medications Ordered in UC Medications  ondansetron (ZOFRAN-ODT) disintegrating tablet 8 mg (8 mg Oral Given 11/30/22 0844)    Initial Impression / Assessment and Plan / UC Course  I have reviewed the triage vital signs and the nursing notes.  Pertinent labs results that were available during my care of the patient were reviewed by me and considered in my medical decision making (see chart for details).  Viral gastroenteritis  He was placed on Bentyl for the cramps, advised to d/c dairy and follow BRAT diet.    Final Clinical Impressions(s) / UC Diagnoses   Final diagnoses:  Other noninfectious gastroenteritis     Discharge Instructions      Your blood work looks good and there are no signs of bacterial infection. You have a stomach virus. Your calcium is a little low, so when you are well, increase your calcium in your diet.  Avoid any daily until your stools are back to normal, it will make the diarrhea worse I have sent a pill for the cramps and you may take it as needed. Stay on bland diet and beef or chicken broth, toast, bananas, plain potatoes, rise or pasta for 2-3 days.       ED Prescriptions     Medication Sig Dispense  Auth. Provider   dicyclomine (BENTYL) 20 MG tablet Take 1 tablet (20 mg total) by mouth 2 (two) times daily. 20 tablet Rodriguez-Southworth, Wilmot Quevedo, PA-C   ondansetron (ZOFRAN-ODT) 8 MG disintegrating tablet Take 1 tablet (8 mg total) by mouth every 8 (eight) hours as needed for nausea or vomiting. 20 tablet Rodriguez-Southworth, Nettie Elm, PA-C      PDMP not reviewed this encounter.   Garey Ham, PA-C 11/30/22 1017

## 2022-11-30 NOTE — ED Triage Notes (Signed)
Pt c/o emesis & diarrhea x3 days, states is not able to keep nothing down. Denies any fevers,chills,or muscle aches. Otc immodium.

## 2022-11-30 NOTE — Discharge Instructions (Addendum)
Your blood work looks good and there are no signs of bacterial infection. You have a stomach virus. Your calcium is a little low, so when you are well, increase your calcium in your diet.  Avoid any daily until your stools are back to normal, it will make the diarrhea worse I have sent a pill for the cramps and you may take it as needed. Stay on bland diet and beef or chicken broth, toast, bananas, plain potatoes, rise or pasta for 2-3 days.

## 2023-12-09 ENCOUNTER — Ambulatory Visit (INDEPENDENT_AMBULATORY_CARE_PROVIDER_SITE_OTHER): Payer: Medicare Other

## 2023-12-09 ENCOUNTER — Ambulatory Visit
Admission: EM | Admit: 2023-12-09 | Discharge: 2023-12-09 | Disposition: A | Discharge status: Home / Self Care | Payer: Medicare Other | Attending: Physician Assistant | Admitting: Physician Assistant

## 2023-12-09 DIAGNOSIS — J441 Chronic obstructive pulmonary disease with (acute) exacerbation: Secondary | ICD-10-CM

## 2023-12-09 DIAGNOSIS — R079 Chest pain, unspecified: Secondary | ICD-10-CM

## 2023-12-09 DIAGNOSIS — R0609 Other forms of dyspnea: Secondary | ICD-10-CM

## 2023-12-09 DIAGNOSIS — R058 Other specified cough: Secondary | ICD-10-CM

## 2023-12-09 MED ORDER — IPRATROPIUM-ALBUTEROL 0.5-2.5 (3) MG/3ML IN SOLN
3.0000 mL | Freq: Once | RESPIRATORY_TRACT | Status: AC
  Administered 2023-12-09: 3 mL via RESPIRATORY_TRACT

## 2023-12-09 NOTE — ED Notes (Signed)
 Patient is being discharged from the Urgent Care and sent to the Emergency Department via POV . Per L. Arvis, PA-C, patient is in need of higher level of care due to need for further cardiac workup. Patient is aware and verbalizes understanding of plan of care.  Vitals:   12/09/23 1552  BP: (!) 159/70  Pulse: 76  Resp: 20  Temp: 98.3 F (36.8 C)  SpO2: 93%

## 2023-12-09 NOTE — Discharge Instructions (Addendum)
-  Your chest x-ray today does not show pneumonia. - I am concerned because your oxygen drops down to 88% when you are walking. - I think you need to have some labs checked to assess your heart enzymes and make sure your symptoms are not heart related.  It may just be related to a flareup of underlying COPD but given your heart history you need to have cardiac enzymes checked and further monitoring.  He also likely benefit from corticosteroids and more breathing treatments.  You have been advised to follow up immediately in the emergency department for concerning signs.symptoms. If you declined EMS transport, please have a family member take you directly to the ED at this time. Do not delay. Based on concerns about condition, if you do not follow up in th e ED, you may risk poor outcomes including worsening of condition, delayed treatment and potentially life threatening issues. If you have declined to go to the ED at this time, you should call your PCP immediately to set up a follow up appointment.  Go to ED for red flag symptoms, including; fevers you cannot reduce with Tylenol/Motrin, severe headaches, vision changes, numbness/weakness in part of the body, lethargy, confusion, intractable vomiting, severe dehydration, chest pain, breathing difficulty, severe persistent abdominal or pelvic pain, signs of severe infection (increased redness, swelling of an area), feeling faint or passing out, dizziness, etc. You should especially go to the ED for sudden acute worsening of condition if you do not elect to go at this time.

## 2023-12-09 NOTE — ED Triage Notes (Addendum)
 Pt c/o chest pain, shortness of breath, cough, and wheezing. He states he fell down steps about a week ago. He describes the pain in his chest as a stabbing pain. Pt has h/o MI and sees cardiology. Denies fever.

## 2023-12-09 NOTE — ED Provider Notes (Signed)
 [facility]    CSN: [id number] Arrival date & time: 12/09/23  1541      History   Chief Complaint Chief Complaint  Patient presents with   Chest Pain   Shortness of Breath   Fall    HPI Derek Solis is a 63 y.o. male with history of coronary artery disease, previous MI, hypertension, Barrett's esophagus, hyperlipidemia, COPD, and tobacco abuse.  Patient presents for 1 to 1-1/2-week history of cough productive of yellowish-green sputum, congestion, shortness of breath and wheezing.  Patient reports today he started to develop a sharp and stabbing pain in the center of his chest which is worse when he lays down.  He also reports increased breathing difficulty and wheezing when lying down.  Additionally reports increased fatigue and shortness of breath upon walking.  Denies syncope.  Denies dizziness.  Reports getting into coughing fits.  Says he got into a coughing fit last week that caused him to fall.  Denies head injury or loss of consciousness.  Patient follows up with cardiology for cardiac disease.  His last appointment was in October 2024.  He says his current chest comfort does not feel like when he has had heart problems.  He wears Nitropatch every day.  Also on atorvastatin, lisinopril, isosorbide and metoprolol .  HPI  Past Medical History:  Diagnosis Date   Coronary artery disease    High cholesterol    Hypertension    MI (mitral incompetence)     There are no active problems to display for this patient.   Past Surgical History:  Procedure Laterality Date   CORONARY ANGIOPLASTY     CORONARY ANGIOPLASTY WITH STENT PLACEMENT         Home Medications    Prior to Admission medications   Medication Sig Start Date End Date Taking? Authorizing Provider  albuterol  (PROVENTIL  HFA;VENTOLIN  HFA) 108 (90 BASE) MCG/ACT inhaler Inhale into the lungs every 6 (six) hours as needed for wheezing or shortness of breath.   Yes [provider]  aspirin   81 MG EC tablet Take by mouth. 06/24/17  Yes [provider]  atorvastatin (LIPITOR) 20 MG tablet Take 20 mg by mouth daily.   Yes [provider]  busPIRone (BUSPAR) 10 MG tablet Take 10 mg by mouth 3 (three) times daily.   Yes [provider]  citalopram (CELEXA) 20 MG tablet Take 20 mg by mouth daily.   Yes [provider]  ferrous sulfate 325 (65 FE) MG tablet TAKE 1 TABLET(325 MG) BY MOUTH THREE TIMES DAILY WITH A MEAL 12/27/19  Yes [provider]  isosorbide mononitrate (IMDUR) 30 MG 24 hr tablet Take by mouth. 04/09/20  Yes [provider]  lisinopril (PRINIVIL,ZESTRIL) 10 MG tablet Take 10 mg by mouth daily.   Yes [provider]  metoprolol  (LOPRESSOR ) 50 MG tablet Take 50 mg by mouth 2 (two) times daily.   Yes [provider]  nitroGLYCERIN (NITRODUR - DOSED IN MG/24 HR) 0.4 mg/hr patch APPLY 1 PATCH TOPICALLY TO THE SKIN DAILY. REMOVE AT NIGHT FOR 12 HOUR NITRATE FREE PERIOD 12/04/23  Yes [provider]  omeprazole (PRILOSEC) 20 MG capsule Take 20 mg by mouth daily.   Yes [provider]  QUEtiapine (SEROQUEL) 100 MG tablet Take 100 mg by mouth at bedtime.   Yes [provider]  traZODone (DESYREL) 100 MG tablet Take 100 mg by mouth at bedtime as needed. 12/27/19  Yes [provider]  baclofen  (LIORESAL ) 10  MG tablet Take 1 tablet (10 mg total) by mouth 3 (three) times daily. 04/12/22   Rodriguez-Southworth, Sylvia, PA-C  dicyclomine  (BENTYL ) 20 MG tablet Take 1 tablet (20 mg total) by mouth 2 (two) times daily. 11/30/22   Rodriguez-Southworth, Sylvia, PA-C  ibuprofen (ADVIL,MOTRIN) 200 MG tablet Take 200 mg by mouth every 6 (six) hours as needed.    [provider]  nitroGLYCERIN (NITROSTAT) 0.4 MG SL tablet Place 0.4 mg under the tongue every 5 (five) minutes as needed for chest pain.    [provider]  ondansetron  (ZOFRAN -ODT) 8 MG disintegrating tablet Take 1 tablet  (8 mg total) by mouth every 8 (eight) hours as needed for nausea or vomiting. 11/30/22   Rodriguez-Southworth, Kyra, PA-C    Family History No family history on file.  Social History Social History   Tobacco Use   Smoking status: Every Day    Current packs/day: 1.00    Types: Cigarettes   Smokeless tobacco: Never  Vaping Use   Vaping status: Never Used  Substance Use Topics   Alcohol use: Not Currently    Alcohol/week: 12.0 - 18.0 standard drinks of alcohol    Types: 12 - 18 Cans of beer per week    Comment: 1-2 a month   Drug use: No     Allergies   Alpha-gal   Review of Systems Review of Systems  Constitutional:  Positive for fatigue. Negative for fever.  HENT:  Negative for congestion, rhinorrhea, sinus pain and sore throat.   Respiratory:  Positive for shortness of breath and wheezing. Negative for cough.   Cardiovascular:  Positive for chest pain. Negative for palpitations and leg swelling.  Gastrointestinal:  Negative for abdominal pain, nausea and vomiting.  Musculoskeletal:  Negative for back pain and myalgias.  Neurological:  Negative for dizziness, syncope, weakness, light-headedness and headaches.  Hematological:  Negative for adenopathy.     Physical Exam Triage Vital Signs ED Triage Vitals  Encounter Vitals Group     BP 12/09/23 1552 (!) 159/70     Systolic BP Percentile --      Diastolic BP Percentile --      Pulse Rate 12/09/23 1552 76     Resp 12/09/23 1552 20     Temp 12/09/23 1552 98.3 F (36.8 C)     Temp Source 12/09/23 1552 Oral     SpO2 12/09/23 1552 93 %     Weight 12/09/23 1551 199 lb 15.3 oz (90.7 kg)     Height 12/09/23 1551 5' 11 (1.803 m)     Head Circumference --      Peak Flow --      Pain Score 12/09/23 1550 8     Pain Loc --      Pain Education --      Exclude from Growth Chart --    No data found.  Updated Vital Signs BP (!) 159/70 (BP Location: Right Arm)   Pulse 76   Temp 98.3 F (36.8 C) (Oral)   Resp 20    Ht 5' 11 (1.803 m)   Wt 199 lb 15.3 oz (90.7 kg)   SpO2 93%   BMI 27.89 kg/m   Physical Exam Vitals and nursing note reviewed.  Constitutional:      General: He is in acute distress (when laying flat.).     Appearance: Normal appearance. He is well-developed. He is not ill-appearing.     Comments: Coughs frequently  HENT:     Head: Normocephalic and  atraumatic.     Nose: Nose normal.     Mouth/Throat:     Mouth: Mucous membranes are moist.     Pharynx: Oropharynx is clear.  Eyes:     General: No scleral icterus.    Conjunctiva/sclera: Conjunctivae normal.  Cardiovascular:     Rate and Rhythm: Normal rate and regular rhythm.     Heart sounds: Normal heart sounds.  Pulmonary:     Effort: Respiratory distress present.     Breath sounds: Wheezing present. No rhonchi.  Abdominal:     Palpations: Abdomen is soft.     Tenderness: There is no abdominal tenderness.  Musculoskeletal:     Cervical back: Neck supple.     Right lower leg: No edema.     Left lower leg: No edema.  Skin:    General: Skin is warm and dry.     Capillary Refill: Capillary refill takes less than 2 seconds.  Neurological:     General: No focal deficit present.     Mental Status: He is alert. Mental status is at baseline.     Motor: No weakness.     Gait: Gait normal.  Psychiatric:        Mood and Affect: Mood normal.        Behavior: Behavior normal.      UC Treatments / Results  Labs (all labs ordered are listed, but only abnormal results are displayed) Labs Reviewed - No data to display  EKG   Radiology DG Chest 2 View Result Date: 12/09/2023 CLINICAL DATA:  Cough, chest pain, shortness of breath worsening for 1 week. EXAM: CHEST - 2 VIEW COMPARISON:  Chest radiographs 09/06/2015 FINDINGS: Cardiac silhouette and mediastinal contours are within normal limits. A coronary stent is again best seen on lateral view. The lungs are clear. No pleural effusion or pneumothorax. Mild multilevel  degenerative disc changes of the thoracic spine. IMPRESSION: No active cardiopulmonary disease. Electronically Signed   By: Tanda Lyons M.D.   On: 12/09/2023 17:58    Procedures ED EKG  Date/Time: 12/09/2023 4:42 PM  Performed by: Arvis Jolan NOVAK, PA-C Authorized by: Arvis Jolan NOVAK, PA-C   Previous ECG:    Previous ECG:  Compared to current   Comparison ECG info:  No first degree AV block on todays EKG as compared to one from 2016 Interpretation:    Interpretation: normal   Rate:    ECG rate assessment: normal   Rhythm:    Rhythm: sinus rhythm   Ectopy:    Ectopy: none   QRS:    QRS axis:  Normal   QRS intervals:  Normal   QRS conduction: normal   ST segments:    ST segments:  Normal T waves:    T waves: normal   Comments:     Normal sinus rhythm. Regular rate.   (including critical care time)  Medications Ordered in UC Medications  ipratropium-albuterol  (DUONEB) 0.5-2.5 (3) MG/3ML nebulizer solution 3 mL (3 mLs Nebulization Given 12/09/23 1642)  ipratropium-albuterol  (DUONEB) 0.5-2.5 (3) MG/3ML nebulizer solution 3 mL (3 mLs Nebulization Given 12/09/23 1726)    Initial Impression / Assessment and Plan / UC Course  I have reviewed the triage vital signs and the nursing notes.  Pertinent labs & imaging results that were available during my care of the patient were reviewed by me and considered in my medical decision making (see chart for details).   63 year old male with history of CAD, previous MI, hypertension, hyperlipidemia and COPD  with tobacco abuse presents for sharp stabbing central chest pain that began today.  Discomfort is intermittent but worse when lying down and moving.  Also reports associated cough which is productive of yellowish-green sputum, wheezing, shortness of breath and fatigue.  Has had cough congestion for the past week.  Denies fever.  Current blood pressure 159/70.  Other vitals normal and stable.  Oxygen 93%.  Increased respiratory rate  especially when lying flat.  Mildly distressed when lying flat.  No swelling of extremities.  Chest with scattered wheezes.  Heart regular rate and rhythm.  No abdominal tenderness.  EKG shows normal sinus rhythm and regular rate.  EKG compared to 1 from 2016.  2016 EKG shows first-degree AV block.  Chest x-ray obtained to assess for possible pneumonia.  Patient given DuoNeb treatment.  Patient reported being able to breathe better with less wheezing and less chest discomfort after DuoNeb treatment.  Oxygen remains about the same and 93%.  Will give a second DuoNeb treatment.  Oxygen 91% after second DuoNeb.  Patient reports symptomatic improvement but oxygen saturation does not improve.  Walked patient down and back the hallway and oxygen dropped to 88%.  He reported some shortness of breath when walking but denied any chest pain.  Chest x-ray report does not show any evidence of pneumonia, effusion or acute abnormality.  Discussed this with patient.  Given patient's significant heart history advised that he seek further and immediate evaluation in the emergency department to likely include cardiac enzymes, monitoring, serial breathing treatments.  Explained to him that he may just be having a COPD exacerbation but I cannot confidently rule out NSTEMI.  Patient is understanding and agreeable and plans to proceed to [facility] ED [place].  He declines EMS transport.  He says that he is can to stop by his house first and then go to the emergency department.  He understands the risks of delaying evaluation in the emergency department.   Final Clinical Impressions(s) / UC Diagnoses   Final diagnoses:  Chest pain, unspecified type  Dyspnea on exertion  Productive cough  COPD exacerbation [facility])     Discharge Instructions      -Your chest x-ray today does not show pneumonia. - I am concerned because your oxygen drops down to 88% when you are walking. - I think you need to have some labs checked  to assess your heart enzymes and make sure your symptoms are not heart related.  It may just be related to a flareup of underlying COPD but given your heart history you need to have cardiac enzymes checked and further monitoring.  He also likely benefit from corticosteroids and more breathing treatments.  You have been advised to follow up immediately in the emergency department for concerning signs.symptoms. If you declined EMS transport, please have a family member take you directly to the ED at this time. Do not delay. Based on concerns about condition, if you do not follow up in th e ED, you may risk poor outcomes including worsening of condition, delayed treatment and potentially life threatening issues. If you have declined to go to the ED at this time, you should call your PCP immediately to set up a follow up appointment.  Go to ED for red flag symptoms, including; fevers you cannot reduce with Tylenol/Motrin, severe headaches, vision changes, numbness/weakness in part of the body, lethargy, confusion, intractable vomiting, severe dehydration, chest pain, breathing difficulty, severe persistent abdominal or pelvic pain, signs of severe infection (increased redness,  swelling of an area), feeling faint or passing out, dizziness, etc. You should especially go to the ED for sudden acute worsening of condition if you do not elect to go at this time.      ED Prescriptions   None    PDMP not reviewed this encounter.   Arvis Jolan NOVAK, PA-C 12/10/23 [id number]

## 2024-07-26 ENCOUNTER — Other Ambulatory Visit: Payer: Self-pay | Admitting: Specialist

## 2024-07-26 DIAGNOSIS — F17210 Nicotine dependence, cigarettes, uncomplicated: Secondary | ICD-10-CM

## 2024-08-08 ENCOUNTER — Ambulatory Visit
Admission: RE | Admit: 2024-08-08 | Discharge: 2024-08-08 | Disposition: A | Discharge status: Home / Self Care | Source: Ambulatory Visit | Attending: Specialist | Admitting: Specialist

## 2024-08-08 DIAGNOSIS — F17210 Nicotine dependence, cigarettes, uncomplicated: Secondary | ICD-10-CM | POA: Diagnosis present

## 2024-08-21 ENCOUNTER — Other Ambulatory Visit: Payer: Self-pay | Admitting: Specialist

## 2024-08-21 DIAGNOSIS — N261 Atrophy of kidney (terminal): Secondary | ICD-10-CM

## 2024-08-21 DIAGNOSIS — R9389 Abnormal findings on diagnostic imaging of other specified body structures: Secondary | ICD-10-CM

## 2024-08-22 ENCOUNTER — Ambulatory Visit
Admission: RE | Admit: 2024-08-22 | Discharge: 2024-08-22 | Disposition: A | Discharge status: Home / Self Care | Source: Ambulatory Visit | Attending: Specialist | Admitting: Specialist

## 2024-08-22 DIAGNOSIS — N261 Atrophy of kidney (terminal): Secondary | ICD-10-CM | POA: Insufficient documentation

## 2024-08-22 DIAGNOSIS — R9389 Abnormal findings on diagnostic imaging of other specified body structures: Secondary | ICD-10-CM | POA: Diagnosis present

## 2024-08-22 MED ORDER — GADOBUTROL 1 MMOL/ML IV SOLN
9.0000 mL | Freq: Once | INTRAVENOUS | Status: AC | PRN
  Administered 2024-08-22: 9 mL via INTRAVENOUS

## 2024-09-10 ENCOUNTER — Other Ambulatory Visit: Payer: Self-pay | Admitting: Nephrology

## 2024-09-10 DIAGNOSIS — I10 Essential (primary) hypertension: Secondary | ICD-10-CM

## 2024-09-10 DIAGNOSIS — N261 Atrophy of kidney (terminal): Secondary | ICD-10-CM

## 2024-09-10 DIAGNOSIS — N182 Chronic kidney disease, stage 2 (mild): Secondary | ICD-10-CM

## 2024-09-14 ENCOUNTER — Ambulatory Visit
Admission: RE | Admit: 2024-09-14 | Discharge: 2024-09-14 | Disposition: A | Discharge status: Home / Self Care | Source: Ambulatory Visit | Attending: Nephrology | Admitting: Nephrology

## 2024-09-14 DIAGNOSIS — I10 Essential (primary) hypertension: Secondary | ICD-10-CM | POA: Insufficient documentation

## 2024-09-14 DIAGNOSIS — N261 Atrophy of kidney (terminal): Secondary | ICD-10-CM | POA: Insufficient documentation

## 2024-09-14 DIAGNOSIS — N182 Chronic kidney disease, stage 2 (mild): Secondary | ICD-10-CM | POA: Diagnosis present

## 2024-10-30 ENCOUNTER — Ambulatory Visit (INDEPENDENT_AMBULATORY_CARE_PROVIDER_SITE_OTHER)

## 2024-10-30 ENCOUNTER — Ambulatory Visit
Admission: EM | Admit: 2024-10-30 | Discharge: 2024-10-30 | Attending: Emergency Medicine | Admitting: Emergency Medicine

## 2024-10-30 DIAGNOSIS — R1032 Left lower quadrant pain: Secondary | ICD-10-CM | POA: Diagnosis not present

## 2024-10-30 NOTE — ED Triage Notes (Signed)
 Pt reports difficulty having bowel movements, and abdominal pain  started Friday is clear in color, has what looks like veins in it. Denies any blood in the stool. Only that it looks similar to slime.

## 2024-10-30 NOTE — ED Notes (Signed)
 Patient is being discharged from the Urgent Care and sent to the Emergency Department via POV . Per Harriette Motto NP, patient is in need of higher level of care due to LLQ abdominal pain. Patient is aware and verbalizes understanding of plan of care.  Vitals:   10/30/24 1135  BP: (!) 148/72  Pulse: (!) 59  Resp: 18  Temp: 98.9 F (37.2 C)  SpO2: 96%

## 2024-10-30 NOTE — Discharge Instructions (Addendum)
 Go to the emergency department to be evaluated for your abdominal pain.  Do not eat or drink anything until after you are seen in the ER.

## 2024-10-30 NOTE — ED Provider Notes (Signed)
 [facility]    CSN: [id number] Arrival date & time: 10/30/24  1026      History   Chief Complaint No chief complaint on file.   HPI Derek Solis is a 62 y.o. male.   HPI  63 year old male with past medical history significant for hypertension, high cholesterol, CAD, and MI presents for evaluation of constant, cramping, 11/10 left lower quadrant abdominal pain that started 4 days ago.  He reports that he has not had a normal bowel movement in the past 4 days.  He will have the urge to have a bowel movement and will have to run to the bathroom where he passes a clear mucus.  No blood.  No fever.  He did have 1 episode of vomiting last night.  Past Medical History:  Diagnosis Date   Coronary artery disease    High cholesterol    Hypertension    MI (mitral incompetence)     There are no active problems to display for this patient.   Past Surgical History:  Procedure Laterality Date   CORONARY ANGIOPLASTY     CORONARY ANGIOPLASTY WITH STENT PLACEMENT         Home Medications    Prior to Admission medications   Medication Sig Start Date End Date Taking? Authorizing Provider  albuterol  (PROVENTIL  HFA;VENTOLIN  HFA) 108 (90 BASE) MCG/ACT inhaler Inhale into the lungs every 6 (six) hours as needed for wheezing or shortness of breath.    [provider]  aspirin  81 MG EC tablet Take by mouth. 06/24/17   [provider]  atorvastatin (LIPITOR) 20 MG tablet Take 20 mg by mouth daily.    [provider]  baclofen  (LIORESAL ) 10 MG tablet Take 1 tablet (10 mg total) by mouth 3 (three) times daily. 04/12/22   Rodriguez-Southworth, Sylvia, PA-C  busPIRone (BUSPAR) 10 MG tablet Take 10 mg by mouth 3 (three) times daily.    [provider]  citalopram (CELEXA) 20 MG tablet Take 20 mg by mouth daily.    [provider]  dicyclomine  (BENTYL ) 20 MG tablet Take 1 tablet (20 mg total) by mouth 2 (two) times daily. 11/30/22    Rodriguez-Southworth, Sylvia, PA-C  ferrous sulfate 325 (65 FE) MG tablet TAKE 1 TABLET(325 MG) BY MOUTH THREE TIMES DAILY WITH A MEAL 12/27/19   [provider]  ibuprofen (ADVIL,MOTRIN) 200 MG tablet Take 200 mg by mouth every 6 (six) hours as needed.    [provider]  isosorbide mononitrate (IMDUR) 30 MG 24 hr tablet Take by mouth. 04/09/20   [provider]  lisinopril (PRINIVIL,ZESTRIL) 10 MG tablet Take 10 mg by mouth daily.    [provider]  metoprolol  (LOPRESSOR ) 50 MG tablet Take 50 mg by mouth 2 (two) times daily.    [provider]  nitroGLYCERIN (NITRODUR - DOSED IN MG/24 HR) 0.4 mg/hr patch APPLY 1 PATCH TOPICALLY TO THE SKIN DAILY. REMOVE AT NIGHT FOR 12 HOUR NITRATE FREE PERIOD 12/04/23   [provider]  nitroGLYCERIN (NITROSTAT) 0.4 MG SL tablet Place 0.4 mg under the tongue every 5 (five) minutes as needed for chest pain.    [provider]  omeprazole (PRILOSEC) 20 MG capsule Take 20 mg by mouth daily.    [provider]  ondansetron  (ZOFRAN -ODT) 8 MG disintegrating tablet Take 1 tablet (8 mg total) by mouth every 8 (eight) hours as needed for nausea or vomiting. 11/30/22   Rodriguez-Southworth, Sylvia, PA-C  QUEtiapine (SEROQUEL) 100 MG  tablet Take 100 mg by mouth at bedtime.    [provider]  traZODone (DESYREL) 100 MG tablet Take 100 mg by mouth at bedtime as needed. 12/27/19   [provider]    Family History History reviewed. No pertinent family history.  Social History Social History   Tobacco Use   Smoking status: Every Day    Current packs/day: 1.00    Types: Cigarettes   Smokeless tobacco: Never  Vaping Use   Vaping status: Never Used  Substance Use Topics   Alcohol use: Not Currently    Alcohol/week: 12.0 - 18.0 standard drinks of alcohol    Types: 12 - 18 Cans of beer per week    Comment: 1-2 a month   Drug use: No     Allergies   Alpha-gal   Review of  Systems Review of Systems  Constitutional:  Negative for fever.  Gastrointestinal:  Positive for abdominal distention, abdominal pain, nausea and vomiting. Negative for anal bleeding and blood in stool.     Physical Exam Triage Vital Signs ED Triage Vitals  Encounter Vitals Group     BP      Girls Systolic BP Percentile      Girls Diastolic BP Percentile      Boys Systolic BP Percentile      Boys Diastolic BP Percentile      Pulse      Resp      Temp      Temp src      SpO2      Weight      Height      Head Circumference      Peak Flow      Pain Score      Pain Loc      Pain Education      Exclude from Growth Chart    No data found.  Updated Vital Signs BP (!) 148/72 (BP Location: Right Arm)   Pulse (!) 59   Temp 98.9 F (37.2 C) (Oral)   Resp 18   SpO2 96%   Visual Acuity Right Eye Distance:   Left Eye Distance:   Bilateral Distance:    Right Eye Near:   Left Eye Near:    Bilateral Near:     Physical Exam Vitals and nursing note reviewed.  Constitutional:      Appearance: Normal appearance. He is ill-appearing.  HENT:     Head: Normocephalic and atraumatic.  Cardiovascular:     Rate and Rhythm: Normal rate and regular rhythm.     Pulses: Normal pulses.     Heart sounds: Normal heart sounds. No murmur heard.    No friction rub. No gallop.  Pulmonary:     Effort: Pulmonary effort is normal.     Breath sounds: Normal breath sounds. No wheezing, rhonchi or rales.  Abdominal:     General: There is distension.     Tenderness: There is abdominal tenderness. There is no guarding or rebound.     Comments: Abdominal distention with marked tenderness in the left lower quadrant.  Skin:    General: Skin is warm and dry.     Capillary Refill: Capillary refill takes less than 2 seconds.     Findings: No rash.  Neurological:     Mental Status: He is alert.      UC Treatments / Results  Labs (all labs ordered are listed, but only abnormal results are  displayed) Labs Reviewed - No data to display  EKG   Radiology DG Abdomen 1 View Result Date: 10/30/2024 EXAM: 1 VIEW XRAY OF THE ABDOMEN 10/30/2024 11:56:40 AM COMPARISON: 06/20/2013 CLINICAL HISTORY: Left lower quadrant abdominal pain. No BM in 4 days. FINDINGS: BOWEL: Nonobstructive bowel gas pattern. SOFT TISSUES: A couple of surgical clips are present in the right lower quadrant. There are mild vascular calcifications. No opaque urinary calculi. BONES: No acute osseous abnormality. IMPRESSION: 1. No acute findings. Electronically signed by: Evalene Coho MD 10/30/2024 12:33 PM EST RP Workstation: [id number]    Procedures Procedures (including critical care time)  Medications Ordered in UC Medications - No data to display  Initial Impression / Assessment and Plan / UC Course  I have reviewed the triage vital signs and the nursing notes.  Pertinent labs & imaging results that were available during my care of the patient were reviewed by me and considered in my medical decision making (see chart for details).   Patient is a pleasant, though mildly ill-appearing, 63 year old male presenting for evaluation of 4 days for the left lower quadrant abdominal pain and no bowel movement.  He had 1 episode of vomiting last night but none since.  No nausea present.  He has not had any fever.  He is unsure when his last colonoscopy was, though I did find a record in epic that the patient had a colonoscopy in 2019 that was also associated with an endoscopy.  The endoscopy showed Barrett's esophagus and the colonoscopy showed 2 hyperplastic polyps in the rectum.  On exam, the patient has a mildly distended abdomen with marked tenderness in the left lower quadrant.  With very light pressure he forced my hand away.  The remainder of his abdomen remains benign.  Given that he has not had any bowel movement in 4 days I will obtain a KUB to evaluate for the presence of constipation.  If his x-ray does not  show overt constipation I will refer him to the emergency department for a CT scan of his abdomen.  KUB independently reviewed and evaluated by me.  Impression: Stool present in the ascending colon and proximal transverse.  The remainder of the colon appears decompressed.  Nonobstructive bowel gas pattern present.  Radiology overread is pending. Radiology impression states no acute findings.  Given patient's abdominal distention and marked pain I do feel he needs to be evaluated in the emergency department.  He is elected to go to [facility] via POV.   Final Clinical Impressions(s) / UC Diagnoses   Final diagnoses:  Left lower quadrant abdominal pain     Discharge Instructions      Go to the emergency department to be evaluated for your abdominal pain.  Do not eat or drink anything until after you are seen in the ER.     ED Prescriptions   None    PDMP not reviewed this encounter.   Bernardino Ditch, NP 10/30/24 1241
